# Patient Record
Sex: Female | Born: 1990 | Race: Asian | Hispanic: No | Marital: Single | Smoking: Never smoker
Health system: Southern US, Community
[De-identification: ages and names within clinical notes are randomized; demographics above are authoritative.]

## PROBLEM LIST (undated history)

## (undated) ENCOUNTER — Inpatient Hospital Stay: Payer: Self-pay

## (undated) DIAGNOSIS — F419 Anxiety disorder, unspecified: Secondary | ICD-10-CM

## (undated) DIAGNOSIS — Z789 Other specified health status: Secondary | ICD-10-CM

## (undated) DIAGNOSIS — F32A Depression, unspecified: Secondary | ICD-10-CM

## (undated) HISTORY — DX: Other specified health status: Z78.9

## (undated) HISTORY — PX: NO PAST SURGERIES: SHX2092

---
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
## 2010-08-14 ENCOUNTER — Emergency Department: Payer: Self-pay | Admitting: Emergency Medicine

## 2011-09-28 ENCOUNTER — Emergency Department: Payer: Self-pay | Admitting: Emergency Medicine

## 2012-08-13 ENCOUNTER — Emergency Department: Payer: Self-pay | Admitting: Emergency Medicine

## 2015-08-03 LAB — HM HIV SCREENING LAB: HM HIV Screening: NEGATIVE

## 2017-02-28 ENCOUNTER — Encounter: Payer: Self-pay | Admitting: Obstetrics and Gynecology

## 2017-03-28 ENCOUNTER — Encounter: Payer: Self-pay | Admitting: Obstetrics and Gynecology

## 2017-05-30 ENCOUNTER — Encounter: Payer: Self-pay | Admitting: Maternal Newborn

## 2017-07-04 ENCOUNTER — Encounter: Payer: Self-pay | Admitting: Maternal Newborn

## 2017-07-04 ENCOUNTER — Ambulatory Visit (INDEPENDENT_AMBULATORY_CARE_PROVIDER_SITE_OTHER): Payer: BLUE CROSS/BLUE SHIELD | Admitting: Maternal Newborn

## 2017-07-04 VITALS — BP 100/70 | HR 78 | Ht 67.0 in | Wt 148.0 lb

## 2017-07-04 DIAGNOSIS — Z131 Encounter for screening for diabetes mellitus: Secondary | ICD-10-CM | POA: Diagnosis not present

## 2017-07-04 DIAGNOSIS — Z13220 Encounter for screening for lipoid disorders: Secondary | ICD-10-CM

## 2017-07-04 DIAGNOSIS — Z113 Encounter for screening for infections with a predominantly sexual mode of transmission: Secondary | ICD-10-CM

## 2017-07-04 DIAGNOSIS — Z01419 Encounter for gynecological examination (general) (routine) without abnormal findings: Secondary | ICD-10-CM | POA: Diagnosis not present

## 2017-07-04 DIAGNOSIS — Z124 Encounter for screening for malignant neoplasm of cervix: Secondary | ICD-10-CM

## 2017-07-04 DIAGNOSIS — Z1329 Encounter for screening for other suspected endocrine disorder: Secondary | ICD-10-CM

## 2017-07-04 DIAGNOSIS — Z1321 Encounter for screening for nutritional disorder: Secondary | ICD-10-CM | POA: Diagnosis not present

## 2017-07-04 NOTE — Progress Notes (Signed)
Gynecology Annual Exam  PCP: Patient, No Pcp Per  Chief Complaint:  Chief Complaint  Patient presents with  . Gynecologic Exam    History of Present Illness: Patient is a 26 y.o. G2P0020 presenting for an annual exam. The patient has no complaints today.   LMP: Patient's last menstrual period was 06/22/2017 (exact date). Average Interval: regular, 28 days Duration of flow: a few days Heavy Menses: no Clots: no Intermenstrual Bleeding: no Postcoital Bleeding: no Dysmenorrhea: no  The patient is sexually active. She currently uses condoms for contraception. She denies dyspareunia.  The patient does not perform self breast exams.  There is no notable family history of breast or ovarian cancer in her family.  The patient wears seatbelts: yes.   The patient has regular exercise: yes.    The patient denies current symptoms of depression.    Review of Systems  Constitutional: Negative for chills, fever and weight loss.  HENT: Negative for ear pain, hearing loss and sore throat.   Eyes: Negative for blurred vision, discharge and redness.  Respiratory: Negative for cough and shortness of breath.   Cardiovascular: Negative for chest pain and palpitations.  Gastrointestinal: Negative for abdominal pain, constipation, diarrhea, heartburn and nausea.  Genitourinary: Negative for dysuria, frequency and urgency.  Musculoskeletal: Negative.   Skin: Negative.   Neurological: Negative for dizziness, sensory change and headaches.  Endo/Heme/Allergies: Negative.   Psychiatric/Behavioral: Negative for depression. The patient is not nervous/anxious.   All other systems reviewed and are negative.   Past Medical History:  History reviewed. No pertinent past medical history.  Past Surgical History:  Past Surgical History:  Procedure Laterality Date  . NO PAST SURGERIES      Gynecologic History:  Patient's last menstrual period was 06/22/2017 (exact date). Contraception:  condoms Last Pap: Normal per patient, unsure of last date  Obstetric History: G2P0020  Family History:  History reviewed. No pertinent family history.  Social History:  Social History   Socioeconomic History  . Marital status: Single    Spouse name: Not on file  . Number of children: Not on file  . Years of education: Not on file  . Highest education level: Not on file  Social Needs  . Financial resource strain: Not on file  . Food insecurity - worry: Not on file  . Food insecurity - inability: Not on file  . Transportation needs - medical: Not on file  . Transportation needs - non-medical: Not on file  Occupational History  . Not on file  Tobacco Use  . Smoking status: Never Smoker  . Smokeless tobacco: Never Used  Substance and Sexual Activity  . Alcohol use: No    Frequency: Never  . Drug use: No  . Sexual activity: Yes    Birth control/protection: Condom  Other Topics Concern  . Not on file  Social History Narrative  . Not on file    Allergies:  No Known Allergies  Medications: Prior to Admission medications   Not on File    Physical Exam Vitals: Blood pressure 100/70, pulse 78, height 5\' 7"  (1.702 m), weight 148 lb (67.1 kg), last menstrual period 06/22/2017.  General: NAD HEENT: normocephalic, anicteric Thyroid: no enlargement, no palpable nodules Pulmonary: No increased work of breathing, CTAB Cardiovascular: RRR, distal pulses 2+ Breasts: Breasts symmetrical, no tenderness, no palpable nodules or masses, no skin or nipple retraction present, no nipple discharge.  No axillary or supraclavicular lymphadenopathy. Abdomen: NABS, soft, non-tender, non-distended.  Umbilicus without lesions.  No hepatomegaly, splenomegaly or masses palpable. No evidence of hernia  Genitourinary:  External: Normal external female genitalia.  Normal urethral  meatus, normal Bartholin's and Skene's glands.    Vagina: Normal vaginal mucosa, no evidence of prolapse.    Cervix:  Grossly normal in appearance, no bleeding  Uterus: Non-enlarged, mobile, normal contour.  No CMT  Adnexa: ovaries non-enlarged, no adnexal masses  Rectal: deferred  Lymphatic: no evidence of inguinal lymphadenopathy Extremities: no edema, erythema, or tenderness Neurologic: Grossly intact Psychiatric: mood appropriate, affect full  Assessment: 26 y.o. female routine annual exam.  Plan: Problem List Items Addressed This Visit    Encounter for annual routine gynecological examination   Relevant Orders   CBC   Comprehensive metabolic panel    Other Visit Diagnoses    Pap smear for cervical cancer screening    -  Primary   Relevant Orders   IGP,CtNgTv,rfx Aptima HPV ASCU   Screening for STDs (sexually transmitted diseases)       Relevant Orders   IGP,CtNgTv,rfx Aptima HPV ASCU   Screening for thyroid disorder       Relevant Orders   TSH   Encounter for vitamin deficiency screening       Relevant Orders   Vitamin D (25 hydroxy)   Screening cholesterol level       Relevant Orders   Lipid panel   Screening for diabetes mellitus       Relevant Orders   Hemoglobin A1c      1) STI screening was offered and accepted.  2) ASCCP guidelines and rationale discussed.  Patient opts for every 3 year screening interval.  3) Contraception - Patient is using barrier methods. Does not currently desire another method.  4) Routine healthcare maintenance including cholesterol, diabetes screening discussed: ordered today and she will return fasting at a later date for lab draw.  5) Follow up 1 year for routine annual exam.  Marcelyn BruinsJacelyn Sydelle Sherfield, CNM 07/04/2017  8:58 AM

## 2017-07-07 ENCOUNTER — Other Ambulatory Visit: Payer: BLUE CROSS/BLUE SHIELD

## 2017-07-08 LAB — IGP,CTNGTV,RFX APTIMA HPV ASCU
Chlamydia, Nuc. Acid Amp: NEGATIVE
Gonococcus, Nuc. Acid Amp: NEGATIVE
PAP SMEAR COMMENT: 0
Trich vag by NAA: NEGATIVE

## 2018-01-22 ENCOUNTER — Other Ambulatory Visit: Payer: Self-pay

## 2018-01-22 ENCOUNTER — Encounter: Payer: Self-pay | Admitting: Emergency Medicine

## 2018-01-22 ENCOUNTER — Emergency Department
Admission: EM | Admit: 2018-01-22 | Discharge: 2018-01-22 | Disposition: A | Discharge status: Home / Self Care | Payer: Self-pay | Attending: Emergency Medicine | Admitting: Emergency Medicine

## 2018-01-22 DIAGNOSIS — O9989 Other specified diseases and conditions complicating pregnancy, childbirth and the puerperium: Secondary | ICD-10-CM | POA: Insufficient documentation

## 2018-01-22 DIAGNOSIS — R630 Anorexia: Secondary | ICD-10-CM | POA: Insufficient documentation

## 2018-01-22 DIAGNOSIS — Z3A01 Less than 8 weeks gestation of pregnancy: Secondary | ICD-10-CM | POA: Insufficient documentation

## 2018-01-22 LAB — CBC WITH DIFFERENTIAL/PLATELET
BASOS PCT: 1 %
Basophils Absolute: 0.1 10*3/uL (ref 0–0.1)
EOS ABS: 0.1 10*3/uL (ref 0–0.7)
Eosinophils Relative: 3 %
HCT: 41.2 % (ref 35.0–47.0)
HEMOGLOBIN: 13.8 g/dL (ref 12.0–16.0)
LYMPHS PCT: 43 %
Lymphs Abs: 2 10*3/uL (ref 1.0–3.6)
MCH: 31.1 pg (ref 26.0–34.0)
MCHC: 33.5 g/dL (ref 32.0–36.0)
MCV: 92.8 fL (ref 80.0–100.0)
MONOS PCT: 8 %
Monocytes Absolute: 0.3 10*3/uL (ref 0.2–0.9)
NEUTROS ABS: 2.1 10*3/uL (ref 1.4–6.5)
Neutrophils Relative %: 45 %
Platelets: 234 10*3/uL (ref 150–440)
RBC: 4.43 MIL/uL (ref 3.80–5.20)
RDW: 12.8 % (ref 11.5–14.5)
WBC: 4.6 10*3/uL (ref 3.6–11.0)

## 2018-01-22 LAB — COMPREHENSIVE METABOLIC PANEL
ALBUMIN: 4.4 g/dL (ref 3.5–5.0)
ALK PHOS: 40 U/L (ref 38–126)
ALT: 9 U/L — AB (ref 14–54)
AST: 30 U/L (ref 15–41)
Anion gap: 9 (ref 5–15)
BUN: 12 mg/dL (ref 6–20)
CO2: 23 mmol/L (ref 22–32)
CREATININE: 0.68 mg/dL (ref 0.44–1.00)
Calcium: 9.3 mg/dL (ref 8.9–10.3)
Chloride: 103 mmol/L (ref 101–111)
GFR calc Af Amer: >60 mL/min (ref >60)
GFR calc non Af Amer: >60 mL/min (ref >60)
GLUCOSE: 119 mg/dL — AB (ref 65–99)
Potassium: 3.5 mmol/L (ref 3.5–5.1)
SODIUM: 135 mmol/L (ref 135–145)
Total Bilirubin: 1 mg/dL (ref 0.3–1.2)
Total Protein: 8.1 g/dL (ref 6.5–8.1)

## 2018-01-22 LAB — URINALYSIS, COMPLETE (UACMP) WITH MICROSCOPIC
Bacteria, UA: NONE SEEN
Bilirubin Urine: NEGATIVE
Glucose, UA: NEGATIVE mg/dL
HGB URINE DIPSTICK: NEGATIVE
Ketones, ur: 20 mg/dL — AB
LEUKOCYTES UA: NEGATIVE
Nitrite: NEGATIVE
PH: 6 (ref 5.0–8.0)
PROTEIN: NEGATIVE mg/dL
Specific Gravity, Urine: 1.013 (ref 1.005–1.030)

## 2018-01-22 LAB — TSH: TSH: 1.833 u[IU]/mL (ref 0.350–4.500)

## 2018-01-22 LAB — HCG, QUANTITATIVE, PREGNANCY: hCG, Beta Chain, Quant, S: 384 m[IU]/mL — ABNORMAL HIGH (ref <5)

## 2018-01-22 LAB — POCT PREGNANCY, URINE: Preg Test, Ur: POSITIVE — AB

## 2018-01-22 LAB — LIPASE, BLOOD: Lipase: 38 U/L (ref 11–51)

## 2018-01-22 MED ORDER — METOCLOPRAMIDE HCL 10 MG PO TABS: 10.0000 mg | ORAL_TABLET | Freq: Three times a day (TID) | ORAL | 0 refills | Status: DC | PRN

## 2018-01-22 NOTE — ED Notes (Signed)
MD at bedside. 

## 2018-01-22 NOTE — ED Notes (Signed)
Pt drinking water at this time. Pt aware urine sample is needed and has a sample cup at bedside.

## 2018-01-22 NOTE — ED Triage Notes (Signed)
Patient reports decreased appetite and nausea x1-2 weeks. States she has also had decreased energy and "doesn't feel like doing anything." Patient denies fever, vomiting, diarrhea.

## 2018-01-22 NOTE — Discharge Instructions (Addendum)
Please seek medical attention for any high fevers, chest pain, shortness of breath, change in behavior, persistent vomiting, bloody stool or any other new or concerning symptoms.  

## 2018-01-22 NOTE — ED Provider Notes (Signed)
[facility] Emergency Department Provider Note  ____________________________________________   I have reviewed the triage vital signs and the nursing notes.   HISTORY  Chief Complaint Anorexia   History limited by: Not Limited   HPI Erica Huerta is a 27 y.o. female who presents to the emergency department today having concern for decreased appetite.  She states this is been going on for about 1 or 2 weeks.  She just feels like she does not have any interest in food.  She can go throughout the day without eating.  She has not had any associated abdominal pain nausea or vomiting.  In addition she has felt increased weakness and tiredness.  She has been more sleepy than normal.  She feels like she is sleeping fine at night.  She denies any depression.  She denies similar symptoms in the past.    History reviewed. No pertinent past medical history.  Patient Active Problem List   Diagnosis Date Noted  . Encounter for annual routine gynecological examination 07/04/2017    Past Surgical History:  Procedure Laterality Date  . NO PAST SURGERIES      Prior to Admission medications   Not on File    Allergies Patient has no known allergies.  No family history on file.  Social History Social History   Tobacco Use  . Smoking status: Never Smoker  . Smokeless tobacco: Never Used  Substance Use Topics  . Alcohol use: Yes    Frequency: Never    Comment: occasional  . Drug use: No    Review of Systems Constitutional: No fever/chills Eyes: No visual changes. ENT: No sore throat. Cardiovascular: Denies chest pain. Respiratory: Denies shortness of breath. Gastrointestinal: Positive for decreased appetite.  Genitourinary: Negative for dysuria. Musculoskeletal: Negative for back pain. Skin: Negative for rash. Neurological: Negative for headaches, focal weakness or numbness.  ____________________________________________   PHYSICAL  EXAM:  VITAL SIGNS: ED Triage Vitals [01/22/18 1925]  Enc Vitals Group     BP 113/72     Pulse Rate 85     Resp 16     Temp 98.5 F (36.9 C)     Temp Source Oral     SpO2 100 %     Weight 148 lb (67.1 kg)     Height 5\' 7"  (1.702 m)     Head Circumference      Peak Flow      Pain Score 0   Constitutional: Alert and oriented.  Eyes: Conjunctivae are normal.  ENT      Head: Normocephalic and atraumatic.      Nose: No congestion/rhinnorhea.      Mouth/Throat: Mucous membranes are moist.      Neck: No stridor. Hematological/Lymphatic/Immunilogical: No cervical lymphadenopathy. Cardiovascular: Normal rate, regular rhythm.  No murmurs, rubs, or gallops.  Respiratory: Normal respiratory effort without tachypnea nor retractions. Breath sounds are clear and equal bilaterally. No wheezes/rales/rhonchi. Gastrointestinal: Soft and non tender. No rebound. No guarding.  Genitourinary: Deferred Musculoskeletal: Normal range of motion in all extremities. No lower extremity edema. Neurologic:  Normal speech and language. No gross focal neurologic deficits are appreciated.  Skin:  Skin is warm, dry and intact. No rash noted. Psychiatric: Mood and affect are normal. Speech and behavior are normal. Patient exhibits appropriate insight and judgment.  ____________________________________________    LABS (pertinent positives/negatives)  Upreg positive Lipase 38 hcg 384 CBC wbc 4.6, hgb 13.8, plt 234 UA not consistent with infection CMP na 135, k 3.5, glu  119, cr 0.68  ____________________________________________   EKG  None  ____________________________________________    RADIOLOGY  None  ____________________________________________   PROCEDURES  Procedures  ____________________________________________   INITIAL IMPRESSION / ASSESSMENT AND PLAN / ED COURSE  Pertinent labs & imaging results that were available during my care of the patient were reviewed by me and  considered in my medical decision making (see chart for details).   Patient presented to the emergency department today because of concern for decreased appetite and fatigue. Differential would be broad including anemia, electrolyte abnormality, depression, pregnancy, infection amongst other etiologies. Work up consistent with very early pregnancy. Discussed this finding with the patient. Will plan on prescribing reglan and discussed importance of prenatal care and vitamins.   ____________________________________________   FINAL CLINICAL IMPRESSION(S) / ED DIAGNOSES  Final diagnoses:  Decreased appetite  Less than [redacted] weeks gestation of pregnancy     Note: This dictation was prepared with Nurse, children's dictation. Any transcriptional errors that result from this process are unintentional     Phineas Semen, MD 01/22/18 2122

## 2018-02-15 ENCOUNTER — Other Ambulatory Visit: Payer: Self-pay

## 2018-02-15 ENCOUNTER — Encounter: Payer: Self-pay | Admitting: Emergency Medicine

## 2018-02-15 ENCOUNTER — Emergency Department
Admission: EM | Admit: 2018-02-15 | Discharge: 2018-02-16 | Disposition: A | Discharge status: Home / Self Care | Payer: BLUE CROSS/BLUE SHIELD | Attending: Emergency Medicine | Admitting: Emergency Medicine

## 2018-02-15 DIAGNOSIS — O219 Vomiting of pregnancy, unspecified: Secondary | ICD-10-CM | POA: Insufficient documentation

## 2018-02-15 DIAGNOSIS — O210 Mild hyperemesis gravidarum: Secondary | ICD-10-CM

## 2018-02-15 DIAGNOSIS — R1030 Lower abdominal pain, unspecified: Secondary | ICD-10-CM | POA: Diagnosis not present

## 2018-02-15 DIAGNOSIS — Z3A01 Less than 8 weeks gestation of pregnancy: Secondary | ICD-10-CM | POA: Insufficient documentation

## 2018-02-15 DIAGNOSIS — Z79899 Other long term (current) drug therapy: Secondary | ICD-10-CM | POA: Diagnosis not present

## 2018-02-15 DIAGNOSIS — Z3491 Encounter for supervision of normal pregnancy, unspecified, first trimester: Secondary | ICD-10-CM

## 2018-02-15 LAB — URINALYSIS, COMPLETE (UACMP) WITH MICROSCOPIC
Bacteria, UA: NONE SEEN
Bilirubin Urine: NEGATIVE
GLUCOSE, UA: 50 mg/dL — AB
Hgb urine dipstick: NEGATIVE
Ketones, ur: 5 mg/dL — AB
Leukocytes, UA: NEGATIVE
Nitrite: NEGATIVE
PH: 7 (ref 5.0–8.0)
Protein, ur: NEGATIVE mg/dL
Specific Gravity, Urine: 1.009 (ref 1.005–1.030)

## 2018-02-15 LAB — CBC
HCT: 41.6 % (ref 35.0–47.0)
Hemoglobin: 14 g/dL (ref 12.0–16.0)
MCH: 30.8 pg (ref 26.0–34.0)
MCHC: 33.6 g/dL (ref 32.0–36.0)
MCV: 91.6 fL (ref 80.0–100.0)
PLATELETS: 245 10*3/uL (ref 150–440)
RBC: 4.54 MIL/uL (ref 3.80–5.20)
RDW: 12.3 % (ref 11.5–14.5)
WBC: 4.3 10*3/uL (ref 3.6–11.0)

## 2018-02-15 LAB — COMPREHENSIVE METABOLIC PANEL
ALBUMIN: 4.4 g/dL (ref 3.5–5.0)
ALT: 10 U/L (ref 0–44)
ANION GAP: 9 (ref 5–15)
AST: 25 U/L (ref 15–41)
Alkaline Phosphatase: 38 U/L (ref 38–126)
BILIRUBIN TOTAL: 0.8 mg/dL (ref 0.3–1.2)
BUN: 9 mg/dL (ref 6–20)
CHLORIDE: 105 mmol/L (ref 98–111)
CO2: 23 mmol/L (ref 22–32)
Calcium: 9.2 mg/dL (ref 8.9–10.3)
Creatinine, Ser: 0.54 mg/dL (ref 0.44–1.00)
GFR calc Af Amer: >60 mL/min (ref >60)
GFR calc non Af Amer: >60 mL/min (ref >60)
GLUCOSE: 144 mg/dL — AB (ref 70–99)
POTASSIUM: 3.4 mmol/L — AB (ref 3.5–5.1)
Sodium: 137 mmol/L (ref 135–145)
TOTAL PROTEIN: 8.2 g/dL — AB (ref 6.5–8.1)

## 2018-02-15 LAB — HCG, QUANTITATIVE, PREGNANCY: hCG, Beta Chain, Quant, S: 66531 m[IU]/mL — ABNORMAL HIGH (ref <5)

## 2018-02-15 LAB — LIPASE, BLOOD: Lipase: 35 U/L (ref 11–51)

## 2018-02-15 MED ORDER — DEXTROSE-NACL 5-0.45 % IV SOLN
Freq: Once | INTRAVENOUS | Status: AC
  Administered 2018-02-15: 22:00:00 via INTRAVENOUS

## 2018-02-15 MED ORDER — ONDANSETRON HCL 4 MG/2ML IJ SOLN
4.0000 mg | Freq: Once | INTRAMUSCULAR | Status: AC
  Administered 2018-02-15: 4 mg via INTRAVENOUS
  Filled 2018-02-15: qty 2

## 2018-02-15 MED ORDER — METOCLOPRAMIDE HCL 5 MG/ML IJ SOLN
10.0000 mg | Freq: Once | INTRAMUSCULAR | Status: AC
  Administered 2018-02-15: 10 mg via INTRAVENOUS
  Filled 2018-02-15: qty 2

## 2018-02-15 MED ORDER — DIPHENHYDRAMINE HCL 50 MG/ML IJ SOLN
25.0000 mg | Freq: Once | INTRAMUSCULAR | Status: AC
  Administered 2018-02-15: 25 mg via INTRAVENOUS
  Filled 2018-02-15: qty 1

## 2018-02-15 MED ORDER — METOCLOPRAMIDE HCL 10 MG PO TABS: 10.0000 mg | ORAL_TABLET | Freq: Four times a day (QID) | ORAL | 0 refills | Status: DC | PRN

## 2018-02-15 NOTE — ED Provider Notes (Signed)
-----------------------------------------   10:51 PM on 02/15/2018 -----------------------------------------  Patient states she is feeling much better, still has approximate 500 cc of fluid remaining, urinalysis pending.  Denies any discomfort, states the nausea is much improved.  We will plan to discharge with Reglan once urinalysis has resulted.  Patient care signed out to overnight physician.   Minna AntisPaduchowski, Toneisha Savary, MD 02/15/18 2252

## 2018-02-15 NOTE — ED Provider Notes (Signed)
[facility] Emergency Department Provider Note  ____________________________________________  Time seen: Approximately 9:53 PM  I have reviewed the triage vital signs and the nursing notes.   HISTORY  Chief Complaint Emesis and Abdominal Pain    HPI Erica Huerta is a 27 y.o. female no past medical history who complains of low abdominal pain radiating to the back, mild, waxing and waning without aggravating or alleviating factors associated with nausea vomiting difficulty eating over the past 3 weeks ever since she learned that she was pregnant.  LMP was Jan 07, 2018, patient's approximately 5 and half weeks.  No prenatal care as of yet.  No vaginal bleeding.  No fever dysuria frequency.  With her symptoms she has had decreased urine output recently.      Past medical history negative   Patient Active Problem List   Diagnosis Date Noted  . Encounter for annual routine gynecological examination 07/04/2017     Past Surgical History:  Procedure Laterality Date  . NO PAST SURGERIES       Prior to Admission medications   Medication Sig Start Date End Date Taking? Authorizing Provider  metoCLOPramide (REGLAN) 10 MG tablet Take 1 tablet (10 mg total) by mouth every 8 (eight) hours as needed for nausea or vomiting. 01/22/18 01/22/19  Phineas SemenGoodman, Graydon, MD     Allergies Patient has no known allergies.   No family history on file.  Social History Social History   Tobacco Use  . Smoking status: Never Smoker  . Smokeless tobacco: Never Used  Substance Use Topics  . Alcohol use: Yes    Frequency: Never    Comment: occasional  . Drug use: No    Review of Systems  Constitutional:   No fever or chills.  ENT:   No sore throat. No rhinorrhea. Cardiovascular:   No chest pain or syncope. Respiratory:   No dyspnea or cough. Gastrointestinal:   Positive as above for abdominal pain and vomiting.  No constipation Musculoskeletal:   Negative for focal  pain or swelling All other systems reviewed and are negative except as documented above in ROS and HPI.  ____________________________________________   PHYSICAL EXAM:  VITAL SIGNS: ED Triage Vitals  Enc Vitals Group     BP 02/15/18 2048 111/60     Pulse Rate 02/15/18 2048 79     Resp 02/15/18 2048 18     Temp 02/15/18 2048 97.6 F (36.4 C)     Temp Source 02/15/18 2048 Oral     SpO2 02/15/18 2048 100 %     Weight --      Height --      Head Circumference --      Peak Flow --      Pain Score 02/15/18 2046 7     Pain Loc --      Pain Edu? --      Excl. in GC? --     Vital signs reviewed, nursing assessments reviewed.   Constitutional:   Alert and oriented. Non-toxic appearance. Eyes:   Conjunctivae are normal. EOMI. PERRL. ENT      Head:   Normocephalic and atraumatic.      Nose:   No congestion/rhinnorhea.       Mouth/Throat:   MMM, no pharyngeal erythema. No peritonsillar mass.       Neck:   No meningismus. Full ROM. Hematological/Lymphatic/Immunilogical:   No cervical lymphadenopathy. Cardiovascular:   RRR. Symmetric bilateral radial and DP pulses.  No murmurs.  Respiratory:  Normal respiratory effort without tachypnea/retractions. Breath sounds are clear and equal bilaterally. No wheezes/rales/rhonchi. Gastrointestinal:   Soft and nontender. Non distended. There is no CVA tenderness.  No rebound, rigidity, or guarding.  Musculoskeletal:   Normal range of motion in all extremities. No joint effusions.  No lower extremity tenderness.  No edema. Neurologic:   Normal speech and language.  Motor grossly intact. No acute focal neurologic deficits are appreciated.  Skin:    Skin is warm, dry and intact. No rash noted.  No petechiae, purpura, or bullae.  ____________________________________________    LABS (pertinent positives/negatives) (all labs ordered are listed, but only abnormal results are displayed) Labs Reviewed  COMPREHENSIVE METABOLIC PANEL - Abnormal;  Notable for the following components:      Result Value   Potassium 3.4 (*)    Glucose, Bld 144 (*)    Total Protein 8.2 (*)    All other components within normal limits  LIPASE, BLOOD  CBC  URINALYSIS, COMPLETE (UACMP) WITH MICROSCOPIC  HCG, QUANTITATIVE, PREGNANCY   ____________________________________________   EKG    ____________________________________________    RADIOLOGY  No results found.  ____________________________________________   PROCEDURES Procedures  ____________________________________________  DIFFERENTIAL DIAGNOSIS   Urinary tract infection, hyperemesis/morning sickness with dehydration  CLINICAL IMPRESSION / ASSESSMENT AND PLAN / ED COURSE  Pertinent labs & imaging results that were available during my care of the patient were reviewed by me and considered in my medical decision making (see chart for details).    Patient is nontoxic, normal vital signs, normal serum labs, presents with 2 to 3 weeks of nausea and vomiting in first trimester pregnancy.  Suspected degree of dehydration due to difficulty with oral intake.  I will treat symptomatically with IV fluids with D5 half-normal saline, Zofran Reglan Benadryl IV.   If she remains unable to take oral intake and has severe ketosis, she may need overnight hospitalization.  Otherwise, anticipate she will be stable for discharge with or without antibiotics as needed.      ____________________________________________   FINAL CLINICAL IMPRESSION(S) / ED DIAGNOSES    Final diagnoses:  First trimester pregnancy  Morning sickness     ED Discharge Orders    None      Portions of this note were generated with dragon dictation software. Dictation errors may occur despite best attempts at proofreading.    Sharman Cheek, MD 02/15/18 2157

## 2018-02-15 NOTE — ED Notes (Signed)
Report called to Nicole RN

## 2018-02-15 NOTE — ED Triage Notes (Addendum)
Patient reports found out 3 weeks ago she was pregnant.  Reports since then she is unable to eat a full course meal, abdominal pain,  nausea, vomiting and no energy.

## 2018-02-16 NOTE — ED Notes (Signed)
Pt resting quietly in bed; visitor has arrived to see pt; pt up to BR to void; will collect specimen

## 2018-03-27 LAB — OB RESULTS CONSOLE HEPATITIS B SURFACE ANTIGEN: Hepatitis B Surface Ag: NEGATIVE

## 2018-03-27 LAB — OB RESULTS CONSOLE RUBELLA ANTIBODY, IGM: Rubella: IMMUNE

## 2018-03-27 LAB — OB RESULTS CONSOLE HIV ANTIBODY (ROUTINE TESTING): HIV: NONREACTIVE

## 2018-03-27 LAB — OB RESULTS CONSOLE RPR: RPR: NONREACTIVE

## 2018-03-30 ENCOUNTER — Other Ambulatory Visit: Payer: Self-pay | Admitting: Certified Nurse Midwife

## 2018-03-30 DIAGNOSIS — Z369 Encounter for antenatal screening, unspecified: Secondary | ICD-10-CM

## 2018-04-09 ENCOUNTER — Ambulatory Visit: Payer: BLUE CROSS/BLUE SHIELD

## 2018-04-09 ENCOUNTER — Ambulatory Visit: Admission: RE | Admit: 2018-04-09 | Payer: BLUE CROSS/BLUE SHIELD | Source: Ambulatory Visit

## 2018-06-23 ENCOUNTER — Observation Stay
Admission: EM | Admit: 2018-06-23 | Discharge: 2018-06-23 | Disposition: A | Discharge status: Home / Self Care | Payer: BLUE CROSS/BLUE SHIELD | Attending: Certified Nurse Midwife | Admitting: Certified Nurse Midwife

## 2018-06-23 ENCOUNTER — Other Ambulatory Visit: Payer: Self-pay

## 2018-06-23 DIAGNOSIS — Z3A26 26 weeks gestation of pregnancy: Secondary | ICD-10-CM | POA: Insufficient documentation

## 2018-06-23 DIAGNOSIS — O212 Late vomiting of pregnancy: Principal | ICD-10-CM | POA: Insufficient documentation

## 2018-06-23 DIAGNOSIS — Z79899 Other long term (current) drug therapy: Secondary | ICD-10-CM | POA: Diagnosis not present

## 2018-06-23 DIAGNOSIS — O219 Vomiting of pregnancy, unspecified: Secondary | ICD-10-CM | POA: Diagnosis present

## 2018-06-23 LAB — URINALYSIS, COMPLETE (UACMP) WITH MICROSCOPIC
Bilirubin Urine: NEGATIVE
Glucose, UA: NEGATIVE mg/dL
Hgb urine dipstick: NEGATIVE
Ketones, ur: NEGATIVE mg/dL
Leukocytes, UA: NEGATIVE
Nitrite: NEGATIVE
Protein, ur: NEGATIVE mg/dL
Specific Gravity, Urine: 1.005 (ref 1.005–1.030)
pH: 8 (ref 5.0–8.0)

## 2018-06-23 LAB — CBC
HCT: 32.6 % — ABNORMAL LOW (ref 36.0–46.0)
HEMOGLOBIN: 10.7 g/dL — AB (ref 12.0–15.0)
MCH: 30.3 pg (ref 26.0–34.0)
MCHC: 32.8 g/dL (ref 30.0–36.0)
MCV: 92.4 fL (ref 80.0–100.0)
Platelets: 243 10*3/uL (ref 150–400)
RBC: 3.53 MIL/uL — AB (ref 3.87–5.11)
RDW: 12.8 % (ref 11.5–15.5)
WBC: 5.2 10*3/uL (ref 4.0–10.5)
nRBC: 0 % (ref 0.0–0.2)

## 2018-06-23 LAB — PROTEIN / CREATININE RATIO, URINE
CREATININE, URINE: 40 mg/dL
Total Protein, Urine: <6 mg/dL

## 2018-06-23 LAB — COMPREHENSIVE METABOLIC PANEL
ALBUMIN: 3.5 g/dL (ref 3.5–5.0)
ALT: 16 U/L (ref 0–44)
AST: 27 U/L (ref 15–41)
Alkaline Phosphatase: 78 U/L (ref 38–126)
Anion gap: 7 (ref 5–15)
BILIRUBIN TOTAL: 0.5 mg/dL (ref 0.3–1.2)
BUN: 6 mg/dL (ref 6–20)
CHLORIDE: 106 mmol/L (ref 98–111)
CO2: 21 mmol/L — ABNORMAL LOW (ref 22–32)
Calcium: 8.8 mg/dL — ABNORMAL LOW (ref 8.9–10.3)
Creatinine, Ser: 0.39 mg/dL — ABNORMAL LOW (ref 0.44–1.00)
GFR calc Af Amer: >60 mL/min (ref >60)
GFR calc non Af Amer: >60 mL/min (ref >60)
GLUCOSE: 75 mg/dL (ref 70–99)
Potassium: 3.6 mmol/L (ref 3.5–5.1)
Sodium: 134 mmol/L — ABNORMAL LOW (ref 135–145)
Total Protein: 7 g/dL (ref 6.5–8.1)

## 2018-06-23 LAB — LIPASE, BLOOD: Lipase: 37 U/L (ref 11–51)

## 2018-06-23 LAB — AMYLASE: AMYLASE: 107 U/L — AB (ref 28–100)

## 2018-06-23 MED ORDER — ACETAMINOPHEN 325 MG PO TABS: 650.0000 mg | ORAL_TABLET | ORAL | Status: DC | PRN

## 2018-06-23 MED ORDER — ONDANSETRON HCL 4 MG/2ML IJ SOLN
4.0000 mg | Freq: Three times a day (TID) | INTRAMUSCULAR | Status: DC
  Administered 2018-06-23: 4 mg via INTRAVENOUS
  Filled 2018-06-23: qty 2

## 2018-06-23 MED ORDER — ONDANSETRON 4 MG PO TBDP: 4.0000 mg | ORAL_TABLET | Freq: Four times a day (QID) | ORAL | 1 refills | Status: DC | PRN

## 2018-06-23 MED ORDER — LACTATED RINGERS IV BOLUS
1000.0000 mL | Freq: Once | INTRAVENOUS | Status: AC
  Administered 2018-06-23: 1000 mL via INTRAVENOUS

## 2018-06-23 MED ORDER — ONDANSETRON 4 MG PO TBDP
4.0000 mg | ORAL_TABLET | Freq: Four times a day (QID) | ORAL | Status: DC
  Administered 2018-06-23: 4 mg via ORAL
  Filled 2018-06-23: qty 1

## 2018-06-23 MED ORDER — LACTATED RINGERS IV SOLN
INTRAVENOUS | Status: DC
  Administered 2018-06-23: 14:00:00 via INTRAVENOUS

## 2018-06-23 NOTE — OB Triage Note (Signed)
Pt G1P0 [redacted]w[redacted]d presents to L&D for n/v since Saturday at 0100. Pt states she has been nauseous since Saturday and vomited anytime she has tried to eat. Pt called the office today and they advised her to come in to be evaluated. Pt denies ctx, vaginal bleeding, vaginal discharge, and states + FM. Monitors applied and assessing. VSS. CNM notified of pt complaint and arrival.

## 2018-06-23 NOTE — Progress Notes (Signed)
   Erica Huerta is a 27 y.o. female. She is at [redacted]w[redacted]d gestation. Patient's last menstrual period was 01/07/2018 (approximate). Estimated Date of Delivery: 09/28/18  Prenatal care site: [facility]  Chief complaint: nausea and vomiting Location: stomach Onset/timing: three days ago Duration: constant Quality: nausea constantly, vomiting whenever she eats Severity: moderate to severe Aggravating or alleviating conditions: none Associated signs/symptoms: none Context: Erica Huerta reports nausea and vomiting that started on Saturday. She had nausea and vomiting in her first trimester of pregnancy, but none since. She reports that she feels nauseous constantly and has vomited anything that she tries to eat. She reports no diarrhea, with last BM yesterday.   S: Resting comfortably.   She reports:  -active fetal movement -no leakage of fluid  -no vaginal bleeding -no contractions  Maternal Medical History:  History reviewed. No pertinent past medical history.  Past Surgical History:  Procedure Laterality Date  . NO PAST SURGERIES      No Known Allergies  Prior to Admission medications   Medication Sig Start Date End Date Taking? Authorizing Provider  metoCLOPramide (REGLAN) 10 MG tablet Take 1 tablet (10 mg total) by mouth every 6 (six) hours as needed for nausea. Patient not taking: Reported on 06/23/2018 02/15/18   Minna Antis, MD     Social History: She  reports that she has never smoked. She has never used smokeless tobacco. She reports that she drinks alcohol. She reports that she does not use drugs.  Family History: family history includes Anxiety disorder in her mother; Depression in her mother; Thyroid disease in her maternal grandmother and mother.   Review of Systems: A full review of systems was performed and negative except as noted in the HPI.     O:  BP 102/64 (BP Location: Left Arm)   Pulse 82   Temp 98.2 F (36.8 C) (Oral)   Resp 16   Ht 5'  7" (1.702 m)   Wt 73 kg   LMP 01/07/2018 (Approximate)   BMI 25.22 kg/m  No results found for this or any previous visit (from the past 48 hour(s)).   Constitutional: NAD, AAOx3  HE/ENT: extraocular movements grossly intact, moist mucous membranes CV: RRR PULM: normal respiratory effort, CTABL     Abd: gravid, non-tender, non-distended, soft      Ext: Non-tender, Nonedmeatous   Psych: mood appropriate, speech normal Pelvic deferred  NST:  Baseline: 145 Variability: moderate Accelerations: 10x10s present Decelerations: absent Time: Toco: quiet   A/P: 27 y.o. [redacted]w[redacted]d here for antenatal surveillance during pregnancy.  Principle diagnosis: nausea and vomiting in pregnancy  Labor  Not present  Fetal Wellbeing  Reactive NST, reassuring for GA  Nausea and vomiting  1L bolus of LR, with IVF at 135mL/hr to follow  Labs ordered  4mg  IV Zofran q8h ordered  Advance diet as tolerated   Genia Del 06/23/2018 1:17 PM  ----- Genia Del, CNM Certified Nurse Midwife [facility], Department of OB/GYN [facility]

## 2018-06-23 NOTE — Discharge Summary (Signed)
Erica Huerta is a 27 y.o. female. She is at [redacted]w[redacted]d gestation. Patient's last menstrual period was 01/07/2018 (approximate). Estimated Date of Delivery: 09/28/18  Prenatal care site: [facility]  Chief complaint: nausea and vomiting Location: stomach Onset/timing: three days ago Duration: constant Quality: nausea constantly, vomiting whenever she eats Severity: moderate to severe Aggravating or alleviating conditions: none Associated signs/symptoms: none Context: Erica Huerta reports nausea and vomiting that started on Saturday. She had nausea and vomiting in her first trimester of pregnancy, but none since. She reports that she feels nauseous constantly and has vomited anything that she tries to eat. She reports no diarrhea, with last BM yesterday.   S: Resting comfortably.   She reports:  -active fetal movement -no leakage of fluid  -no vaginal bleeding -no contractions  Maternal Medical History:  History reviewed. No pertinent past medical history.  Past Surgical History:  Procedure Laterality Date  . NO PAST SURGERIES      No Known Allergies  Prior to Admission medications   Medication Sig Start Date End Date Taking? Authorizing Provider  metoCLOPramide (REGLAN) 10 MG tablet Take 1 tablet (10 mg total) by mouth every 6 (six) hours as needed for nausea. Patient not taking: Reported on 06/23/2018 02/15/18   Minna Antis, MD     Social History: She  reports that she has never smoked. She has never used smokeless tobacco. She reports that she drinks alcohol. She reports that she does not use drugs.  Family History: family history includes Anxiety disorder in her mother; Depression in her mother; Thyroid disease in her maternal grandmother and mother.   Review of Systems: A full review of systems was performed and negative except as noted in the HPI.     O:  BP 105/63 (BP Location: Left Arm)   Pulse 77   Temp 97.8 F (36.6 C) (Oral)   Resp 14   Ht 5'  7" (1.702 m)   Wt 73 kg   LMP 01/07/2018 (Approximate)   BMI 25.22 kg/m  Results for orders placed or performed during the hospital encounter of 06/23/18 (from the past 48 hour(s))  Comprehensive metabolic panel   Collection Time: 06/23/18  1:12 PM  Result Value Ref Range   Sodium 134 (L) 135 - 145 mmol/L   Potassium 3.6 3.5 - 5.1 mmol/L   Chloride 106 98 - 111 mmol/L   CO2 21 (L) 22 - 32 mmol/L   Glucose, Bld 75 70 - 99 mg/dL   BUN 6 6 - 20 mg/dL   Creatinine, Ser 1.30 (L) 0.44 - 1.00 mg/dL   Calcium 8.8 (L) 8.9 - 10.3 mg/dL   Total Protein 7.0 6.5 - 8.1 g/dL   Albumin 3.5 3.5 - 5.0 g/dL   AST 27 15 - 41 U/L   ALT 16 0 - 44 U/L   Alkaline Phosphatase 78 38 - 126 U/L   Total Bilirubin 0.5 0.3 - 1.2 mg/dL   GFR calc non Af Amer >60 >60 mL/min   GFR calc Af Amer >60 >60 mL/min   Anion gap 7 5 - 15  Lipase, blood   Collection Time: 06/23/18  1:12 PM  Result Value Ref Range   Lipase 37 11 - 51 U/L  CBC on admission   Collection Time: 06/23/18  1:12 PM  Result Value Ref Range   WBC 5.2 4.0 - 10.5 K/uL   RBC 3.53 (L) 3.87 - 5.11 MIL/uL   Hemoglobin 10.7 (L) 12.0 - 15.0 g/dL  HCT 32.6 (L) 36.0 - 46.0 %   MCV 92.4 80.0 - 100.0 fL   MCH 30.3 26.0 - 34.0 pg   MCHC 32.8 30.0 - 36.0 g/dL   RDW 62.1 30.8 - 65.7 %   Platelets 243 150 - 400 K/uL   nRBC 0.0 0.0 - 0.2 %  Amylase   Collection Time: 06/23/18  1:12 PM  Result Value Ref Range   Amylase 107 (H) 28 - 100 U/L  Urinalysis, Complete w Microscopic   Collection Time: 06/23/18  2:22 PM  Result Value Ref Range   Color, Urine STRAW (A) YELLOW   APPearance CLEAR (A) CLEAR   Specific Gravity, Urine 1.005 1.005 - 1.030   pH 8.0 5.0 - 8.0   Glucose, UA NEGATIVE NEGATIVE mg/dL   Hgb urine dipstick NEGATIVE NEGATIVE   Bilirubin Urine NEGATIVE NEGATIVE   Ketones, ur NEGATIVE NEGATIVE mg/dL   Protein, ur NEGATIVE NEGATIVE mg/dL   Nitrite NEGATIVE NEGATIVE   Leukocytes, UA NEGATIVE NEGATIVE   RBC / HPF 0-5 0 - 5 RBC/hpf   WBC,  UA 0-5 0 - 5 WBC/hpf   Bacteria, UA RARE (A) NONE SEEN   Squamous Epithelial / LPF 6-10 0 - 5  Protein / creatinine ratio, urine   Collection Time: 06/23/18  2:22 PM  Result Value Ref Range   Creatinine, Urine 40 mg/dL   Total Protein, Urine <6 mg/dL   Protein Creatinine Ratio        0.00 - 0.15 mg/mg[Cre]     Constitutional: NAD, AAOx3  HE/ENT: extraocular movements grossly intact, moist mucous membranes CV: RRR PULM: normal respiratory effort, CTABL     Abd: gravid, non-tender, non-distended, soft      Ext: Non-tender, Nonedmeatous   Psych: mood appropriate, speech normal Pelvic deferred  NST:  Baseline: 145 Variability: moderate Accelerations: 10x10s present Decelerations: absent Time: Toco: quiet   A/P: 27 y.o. [redacted]w[redacted]d here for antenatal surveillance during pregnancy.  Principle diagnosis: nausea and vomiting in pregnancy  Labor  Not present  Fetal Wellbeing  Reactive NST, reassuring for GA  Nausea and vomiting  S/p 1L bolus of LR with IVF  Labs with no significant abnormalities, reviewed with Dr. Dalbert Huerta  4mg  IV Zofran given  Tolerating regular diet  D/c home stable, precautions reviewed, follow-up as scheduled.   Prescription for Zofran ODT 4mg  q6-8 PRN sent to preferred pharamcy  Next routine appointment on 07/10/18   Erica Huerta 06/23/2018 6:42 PM  ----- Erica Huerta, CNM Certified Nurse Midwife [facility], Department of OB/GYN [facility]

## 2018-07-28 ENCOUNTER — Other Ambulatory Visit: Payer: Self-pay | Admitting: Obstetrics and Gynecology

## 2018-07-28 DIAGNOSIS — Z3689 Encounter for other specified antenatal screening: Secondary | ICD-10-CM

## 2018-07-30 ENCOUNTER — Other Ambulatory Visit: Payer: Self-pay

## 2018-07-30 DIAGNOSIS — Z3689 Encounter for other specified antenatal screening: Secondary | ICD-10-CM

## 2018-08-03 ENCOUNTER — Other Ambulatory Visit: Payer: Self-pay | Admitting: Obstetrics and Gynecology

## 2018-08-03 ENCOUNTER — Ambulatory Visit
Admission: RE | Admit: 2018-08-03 | Discharge: 2018-08-03 | Disposition: A | Discharge status: Home / Self Care | Payer: BLUE CROSS/BLUE SHIELD | Source: Ambulatory Visit | Attending: Maternal & Fetal Medicine | Admitting: Maternal & Fetal Medicine

## 2018-08-03 DIAGNOSIS — Z362 Encounter for other antenatal screening follow-up: Secondary | ICD-10-CM | POA: Diagnosis present

## 2018-08-03 DIAGNOSIS — O365931 Maternal care for other known or suspected poor fetal growth, third trimester, fetus 1: Secondary | ICD-10-CM

## 2018-08-03 DIAGNOSIS — Z3689 Encounter for other specified antenatal screening: Secondary | ICD-10-CM

## 2018-08-03 DIAGNOSIS — Z3A32 32 weeks gestation of pregnancy: Secondary | ICD-10-CM | POA: Diagnosis not present

## 2018-08-03 DIAGNOSIS — O365930 Maternal care for other known or suspected poor fetal growth, third trimester, not applicable or unspecified: Secondary | ICD-10-CM | POA: Insufficient documentation

## 2018-08-06 ENCOUNTER — Other Ambulatory Visit: Payer: Self-pay

## 2018-08-06 DIAGNOSIS — O365990 Maternal care for other known or suspected poor fetal growth, unspecified trimester, not applicable or unspecified: Secondary | ICD-10-CM

## 2018-08-10 ENCOUNTER — Ambulatory Visit
Admission: RE | Admit: 2018-08-10 | Discharge: 2018-08-10 | Disposition: A | Discharge status: Home / Self Care | Payer: BLUE CROSS/BLUE SHIELD | Source: Ambulatory Visit | Attending: Maternal & Fetal Medicine | Admitting: Maternal & Fetal Medicine

## 2018-08-10 DIAGNOSIS — Z3A33 33 weeks gestation of pregnancy: Secondary | ICD-10-CM | POA: Diagnosis not present

## 2018-08-10 DIAGNOSIS — O365930 Maternal care for other known or suspected poor fetal growth, third trimester, not applicable or unspecified: Secondary | ICD-10-CM | POA: Insufficient documentation

## 2018-08-10 DIAGNOSIS — O365990 Maternal care for other known or suspected poor fetal growth, unspecified trimester, not applicable or unspecified: Secondary | ICD-10-CM

## 2018-08-13 ENCOUNTER — Encounter: Payer: Self-pay | Admitting: *Deleted

## 2018-08-13 ENCOUNTER — Other Ambulatory Visit: Payer: Self-pay

## 2018-08-13 ENCOUNTER — Inpatient Hospital Stay
Admission: EM | Admit: 2018-08-13 | Discharge: 2018-08-14 | Disposition: A | Discharge status: Home / Self Care | Payer: BLUE CROSS/BLUE SHIELD | Attending: Obstetrics and Gynecology | Admitting: Obstetrics and Gynecology

## 2018-08-13 ENCOUNTER — Inpatient Hospital Stay: Payer: BLUE CROSS/BLUE SHIELD

## 2018-08-13 DIAGNOSIS — O4693 Antepartum hemorrhage, unspecified, third trimester: Secondary | ICD-10-CM | POA: Diagnosis not present

## 2018-08-13 DIAGNOSIS — Z3A33 33 weeks gestation of pregnancy: Secondary | ICD-10-CM | POA: Diagnosis not present

## 2018-08-13 DIAGNOSIS — N939 Abnormal uterine and vaginal bleeding, unspecified: Secondary | ICD-10-CM | POA: Diagnosis present

## 2018-08-13 DIAGNOSIS — O365990 Maternal care for other known or suspected poor fetal growth, unspecified trimester, not applicable or unspecified: Secondary | ICD-10-CM

## 2018-08-13 MED ORDER — PRENATAL MULTIVITAMIN CH: 1.0000 | ORAL_TABLET | Freq: Every day | ORAL | Status: DC

## 2018-08-13 MED ORDER — DOCUSATE SODIUM 100 MG PO CAPS: 100.0000 mg | ORAL_CAPSULE | Freq: Every day | ORAL | Status: DC

## 2018-08-13 MED ORDER — ZOLPIDEM TARTRATE 5 MG PO TABS: 5.0000 mg | ORAL_TABLET | Freq: Every evening | ORAL | Status: DC | PRN

## 2018-08-13 MED ORDER — ACETAMINOPHEN 325 MG PO TABS: 650.0000 mg | ORAL_TABLET | ORAL | Status: DC | PRN

## 2018-08-13 MED ORDER — CALCIUM CARBONATE ANTACID 500 MG PO CHEW: 2.0000 | CHEWABLE_TABLET | ORAL | Status: DC | PRN

## 2018-08-13 NOTE — ED Triage Notes (Signed)
TRIAGE NOTE to rule out Preterm Labor   History of Present Illness: Erica PeersChelsea L Huerta is a 27 y.o. G1P0000 at 5149w3d presenting to triage for vaginal bleeding x3 days, initially bright red, now spotting. Nickle sized spot on panty liner. No recent intercourse. No contractions. Good fetal movement. No gushes of clear fluid.  Pregnancy c/b fetal growth restriction in 3%, last ultrasound 3 days ago with AFI low but normal at 6cm, and a clear 2x2 pocket. No history of placenta previa or low lying placenta.  Patient Active Problem List   Diagnosis Date Noted  . Nausea/vomiting in pregnancy 06/23/2018  . Encounter for annual routine gynecological examination 07/04/2017    History reviewed. No pertinent past medical history.  Past Surgical History:  Procedure Laterality Date  . NO PAST SURGERIES      OB History  Gravida Para Term Preterm AB Living  1 0 0 0 0 0  SAB TAB Ectopic Multiple Live Births  0 0 0 0 0    # Outcome Date GA Lbr Len/2nd Weight Sex Delivery Anes PTL Lv  1 Current             Social History   Socioeconomic History  . Marital status: Single    Spouse name: Not on file  . Number of children: Not on file  . Years of education: Not on file  . Highest education level: Not on file  Occupational History  . Occupation: Nature conservation officerperations Manager  Social Needs  . Financial resource strain: Not hard at all  . Food insecurity:    Worry: Never true    Inability: Never true  . Transportation needs:    Medical: No    Non-medical: No  Tobacco Use  . Smoking status: Never Smoker  . Smokeless tobacco: Never Used  Substance and Sexual Activity  . Alcohol use: Not Currently    Frequency: Never    Comment: occasional  . Drug use: No  . Sexual activity: Yes    Birth control/protection: Condom  Lifestyle  . Physical activity:    Days per week: 0 days    Minutes per session: 0 min  . Stress: Not at all  Relationships  . Social connections:    Talks on phone: More than  three times a week    Gets together: More than three times a week    Attends religious service: Never    Active member of club or organization: No    Attends meetings of clubs or organizations: Never    Relationship status: Never married  Other Topics Concern  . Not on file  Social History Narrative  . Not on file    Family History  Problem Relation Age of Onset  . Anxiety disorder Mother   . Depression Mother   . Thyroid disease Mother   . Thyroid disease Maternal Grandmother     No Known Allergies  Medications Prior to Admission  Medication Sig Dispense Refill Last Dose  . Prenatal Vit-Fe Fumarate-FA (PRENATAL MULTIVITAMIN) TABS tablet Take 1 tablet by mouth daily at 12 noon.       Review of Systems - See HPI for OB specific ROS.   Vitals:  BP 108/67 (BP Location: Left Arm)   Pulse 90   Temp 98.6 F (37 C) (Oral)   Resp 16   Ht 5\' 7"  (1.702 m)   Wt 78 kg   LMP 01/07/2018 (Approximate)   BMI 26.94 kg/m  Physical Examination: CONSTITUTIONAL: Well-developed, well-nourished female in no  acute distress.  ABDOMEN: Soft, nontender, nondistended, gravid.  Cervix: deferred Membranes:intact Fetal Monitoring:Baseline: 145 bpm, Variability: Good {> 6 bpm), Accelerations: Reactive and Decelerations: Absent Tocometer: Flat  Labs:  No results found for this or any previous visit (from the past 24 hour(s)).  Imaging Studies: [place] Mfm Fetal Bpp W/nonstress  Result Date: 08/10/2018 ----------------------------------------------------------------------  OBSTETRICS REPORT                       (Signed Final 08/10/2018 02:19 pm) ---------------------------------------------------------------------- PATIENT INFO:  ID #:       [id number]                          D.O.B.:  07/07/1991 (27 yrs)  Name:       Erica Huerta              Visit Date: 08/10/2018 01:46 pm ---------------------------------------------------------------------- PERFORMED BY:  Performed By:     Bridgette Habermann         Referred By:      Melonie Florida ---------------------------------------------------------------------- SERVICE(S) PROVIDED:   [place] MFM FETAL BPP W/NONSTRESS                         78295.6  ---------------------------------------------------------------------- INDICATIONS:   [redacted] weeks gestation of pregnancy                Z3A.33  ---------------------------------------------------------------------- FETAL EVALUATION:  Num Of Fetuses:         1  Fetal Heart Rate(bpm):  155  Cardiac Activity:       Present  Presentation:           Cephalic  Placenta:               Anterior  AFI Sum(cm)     %Tile       Largest Pocket(cm)  6.67            < 3         3.1  RUQ(cm)       RLQ(cm)       LUQ(cm)        LLQ(cm)  2.44          0             1.13           3.1 ---------------------------------------------------------------------- BIOPHYSICAL EVALUATION:  Amniotic F.V:   Within normal limits       F. Tone:        Observed  F. Movement:    Observed                   Score:          8/8  F. Breathing:   Observed ---------------------------------------------------------------------- GESTATIONAL AGE:  LMP:           30w 5d        Date:  01/07/18                 EDD:   10/14/18  Clinical EDD:  33w 0d  EDD:   09/28/18  Best:          33w 0d     Det. By:  Clinical EDD             EDD:   09/28/18 ---------------------------------------------------------------------- DOPPLER - FETAL VESSELS:  Umbilical Artery   S/D     %tile     RI    %tile                     PSV                                                   (cm/s)   3.5       90   0.71        90                     35.3 ---------------------------------------------------------------------- IMPRESSION:  ,  Thank you for referring your patient for antental testing due to  fetal growth restriction (efw 1460 (3%) on 08/03/18).  There is a singleton gestation at 33 weeks with normal   amniotic fluid volume.  Dating is by earliest available  ultrasound performed at [facility] on 04/02/18;  measurments were ocnsistent iwth 14 weeks 3 days.  The BPP was noted to be 8/8, which was reassuring.  The  umbilical artery doppler is 3.5 (normal).  The amniotic fluid  volume is 6.7cm  (2x2 pocket present).  Continue twice weeky antental testing and weekly dopplers  (she has appointment scheduled). Follow up growth every 3  weeks.  Thank you for allowing [place] to participate in your patient's care.  Please do not hesitate to contact us if we can be of further  assistance. ----------------------------------------------------------------------                   Consuelo Pandy, MD Electronically Signed Final Report   08/10/2018 02:19 pm ----------------------------------------------------------------------  [place] Mfm Ob Detail +14 Wk  Result Date: 08/03/2018 ----------------------------------------------------------------------  OBSTETRICS REPORT                       (Signed Final 08/03/2018 03:57 pm) ---------------------------------------------------------------------- PATIENT INFO:  ID #:       [id number]                          D.O.B.:  [birth year] (27 yrs)  Name:       Erica Huerta              Visit Date: 08/03/2018 02:32 pm ---------------------------------------------------------------------- PERFORMED BY:  Performed By:     Loretha Brasil       Referred By:      Genia Del ---------------------------------------------------------------------- SERVICE(S) PROVIDED:   [place] MFM OB DETAIL +14 WK                              76811.01  ----------------------------------------------------------------------  INDICATIONS:   [redacted] weeks gestation of pregnancy                Z3A.32  ---------------------------------------------------------------------- FETAL EVALUATION:  Num Of Fetuses:         1  Fetal Heart Rate(bpm):  145  Cardiac Activity:        Present  Presentation:           Vertex  Placenta:               Anterior Grade , No previa  AFI Sum(cm)     %Tile       Largest Pocket(cm)  13.36           42          5.3  RUQ(cm)       RLQ(cm)       LUQ(cm)        LLQ(cm)  1.79          3.54          2.73           5.3 ---------------------------------------------------------------------- BIOMETRY:  BPD:      83.7  mm     G. Age:  33w 5d         86  %    CI:        82.91   %    70 - 86                                                          FL/HC:      20.0   %    19.1 - 21.3  HC:      289.9  mm     G. Age:  31w 6d         14  %    HC/AC:      1.19        0.96 - 1.17  AC:      243.2  mm     G. Age:  28w 4d        < 3  %    FL/BPD:     69.2   %    71 - 87  FL:       57.9  mm     G. Age:  30w 2d          6  %    FL/AC:      23.8   %    20 - 24  HUM:      52.7  mm     G. Age:  30w 5d         24  %  NFT:       4.1  mm  CM:        7.3  mm  Est. FW:    1460  gm      3 lb 3 oz    < 3  % ---------------------------------------------------------------------- GESTATIONAL AGE:  LMP:           29w 5d        Date:  01/07/18                 EDD:   10/14/18  Clinical EDD:  Armida Sans32w 0d  EDD:   09/28/18  U/S Today:     31w 1d                                        EDD:   10/04/18  Best:          32w 0d     Det. By:  Clinical EDD             EDD:   09/28/18 ---------------------------------------------------------------------- ANATOMY:  Cranium:               Within Normal Limits   Aortic Arch:            Normal appearance  Cavum:                 CSP visualized         Ductal Arch:            Normal appearance  Ventricles:            Normal appearance      Diaphragm:              Within Normal Limits  Choroid Plexus:        Within Normal Limits   Stomach:                Seen  Cerebellum:            Within Normal Limits   Abdomen:                Within Normal                                                                        Limits  Posterior  Fossa:       Within Normal Limits   Abdominal Wall:         Normal appearance  Nuchal Fold:           Within Normal Limits   Cord Vessels:           3 vessels  Face:                  Suboptimal             Kidneys:                Normal appearance  Lips:                  Normal appearance      Bladder:                Seen  Thoracic:              Within Normal Limits   Spine:                  Normal appearance  Heart:                 4-Chamber view         Upper Extremities:      Visualized  appears normal  RVOT:                  Normal appearance      Lower Extremities:      Visualized  LVOT:                  Normal appearance ---------------------------------------------------------------------- DOPPLER - FETAL VESSELS:  Umbilical Artery   S/D  3.47 ---------------------------------------------------------------------- CERVIX UTERUS ADNEXA:  Cervix  Length:           3.95  cm. ---------------------------------------------------------------------- IMPRESSION:  Dear Dr.   Particia Nearing,  Thank you for referring your patient for detailed anatomic  survey and fetal growth assessment due to new onset FGR.  Ultrasound demonstrates a single, live fetus at 32 weeks 0  days.  Dating is by earliest available ultrasound performed at  [facility] on 04/02/18; measurements were consistent  with 14w 3 d (EDD 09/28/18)  Detailed evaluation of the fetal anatomy was performed.  The  fetal anatomical survey appears within normal limits within  the resolution of ultrasound as described above.  Nonetheless, all anatomical structures could not be  adequately visualized due to the early gestational age.  The estimated fetal weight is 1460g (<3rd percentile)  The  umbilical dopplers are normal and the amniotic fluid volume  is normal.  BPP=8/8.  Fetal growth restriction is present today.  We reviewed some  of the risk factors---maternal exposures such as tobacco (she  does not smoke) and chronic conditions (eg  hypertension),  as well as medications (she does not have medication  exposures associated wtih FGR), as well as infectious  processes and aneuploidy.  We did not see sonographic  features associated with aneuploidy or overt infectious  processes.  We addressed the association of FGR with  uteroplacental insufficiency and the need for closer fetal  surveillance.  Recommend twice weekly testing with weekly nst or bpp and  afi/dopplers (we have scheduled appointments here for the  next 3 weeks) alternating with weekly NST (can be performed  in your office--she has an appointment later this week).  We  will reassess fetal growth in 3 weeks.  If growth continues at  less than the 3rd percentile, we would recommend delivery in  the 37th week.  Delivery sooner would be indicated if testing  is nonreassuring.  Thank you for allowing [place] to participate in your patient's care. ----------------------------------------------------------------------                   Consuelo Pandy, MD Electronically Signed Final Report   08/03/2018 03:57 pm ----------------------------------------------------------------------    Assessment and Plan: Patient Active Problem List   Diagnosis Date Noted  . Nausea/vomiting in pregnancy 06/23/2018  . Encounter for annual routine gynecological examination 07/04/2017    1. Reassuring fetal monitoring. Will get ultrasound for placental, fluid and cervical evaluation

## 2018-08-13 NOTE — OB Triage Note (Signed)
Pt states she has had spotting since Tuesday, bright red initially, which is now pink. She is wearing a panty liner and says she has nickel size spots and changes her panty liner about once a day. She has not had intercourse. Positive fetal movement, no other leaking of fluid or contractions. She added that she has ultrasounds every Monday for IUGR, and her fluid level was normal this past Monday.

## 2018-08-14 DIAGNOSIS — O4693 Antepartum hemorrhage, unspecified, third trimester: Secondary | ICD-10-CM | POA: Diagnosis not present

## 2018-08-14 NOTE — Discharge Summary (Signed)
Pt returned from ultrasound - final read pending. However, no further bleeding on pad since arrival.  Ultrasound with normal anterior placenta, edge 9cm from internal os. No evidence of placental abruption.  AFI 6.3, in keeping with scan 4 days ago.  Pt discharged home with precautions.

## 2018-08-14 NOTE — Discharge Instructions (Signed)
Vaginal Bleeding During Pregnancy, Third Trimester ° °A small amount of bleeding from the vagina (spotting) is relatively common during pregnancy. Various things can cause bleeding or spotting during pregnancy. Sometimes bleeding is normal and is not a problem. However, bleeding during the third trimester can also be a sign of something serious for the mother and the baby. Be sure to tell your health care provider about any vaginal bleeding right away. °Some possible causes of vaginal bleeding during the third trimester include: °· Infection or growths (polyps) on the cervix. °· A condition in which the placenta partially or completely covers the opening of the cervix inside the uterus (placenta previa). °· The placenta separating from the uterus (placenta abruption). °· The start of labor (discharging of the mucus plug). °· A condition in which the placenta grows into the muscle layer of the uterus (placenta accreta). °Follow these instructions at home: °Activity °· Follow instructions from your health care provider about limiting your activity. If your health care provider recommends activity restriction, you may need to stay in bed and only get up to use the bathroom. In some cases, your health care provider may allow you to continue light activity. °· If needed, make plans for someone to help with your regular activities. °· Ask your health care provider if it is safe for you to drive. °· Do not lift anything that is heavier than 10 lb (4.5 kg), or the limit that your health care provider tells you, until he or she says that it is safe. °· Do not have sex or orgasms until your health care provider says that this is safe. °Medicines °· Take over-the-counter and prescription medicines only as told by your health care provider. °· Do not take aspirin because it can cause bleeding. °General instructions °· Pay attention to any changes in your symptoms. °· Write down how many pads you use each day, how often you  change pads, and how soaked (saturated) they are. °· Do not use tampons or douche. °· If you pass any tissue from your vagina, save the tissue so you can show it to your health care provider. °· Keep all follow-up visits as told by your health care provider. This is important. °Contact a health care provider if: °· You have vaginal bleeding during any part of your pregnancy. °· You have cramps or labor pains. °· You have a fever. °Get help right away if: °· You have severe cramps or pain in your back or abdomen. °· You have a gush of fluid from the vagina. °· You pass large clots or a large amount of tissue from your vagina. °· Your bleeding increases. °· You feel light-headed or weak. °· You faint. °· You feel that your baby is moving less than usual, or not moving at all. °Summary °· Various things can cause bleeding or spotting in pregnancy. °· Bleeding during the third trimester can be a sign of a serious problem for the mother and the baby. °· Be sure to tell your health care provider about any vaginal bleeding right away. °This information is not intended to replace advice given to you by your health care provider. Make sure you discuss any questions you have with your health care provider. °Document Released: 10/26/2002 Document Revised: 11/07/2016 Document Reviewed: 11/07/2016 °Elsevier Interactive Patient Education © 2019 [employer] ° °

## 2018-08-17 ENCOUNTER — Other Ambulatory Visit: Payer: Self-pay

## 2018-08-17 ENCOUNTER — Ambulatory Visit
Admission: RE | Admit: 2018-08-17 | Discharge: 2018-08-17 | Disposition: A | Discharge status: Home / Self Care | Payer: BLUE CROSS/BLUE SHIELD | Source: Ambulatory Visit | Attending: Obstetrics and Gynecology | Admitting: Obstetrics and Gynecology

## 2018-08-17 DIAGNOSIS — O365990 Maternal care for other known or suspected poor fetal growth, unspecified trimester, not applicable or unspecified: Secondary | ICD-10-CM

## 2018-08-17 DIAGNOSIS — O365931 Maternal care for other known or suspected poor fetal growth, third trimester, fetus 1: Secondary | ICD-10-CM | POA: Insufficient documentation

## 2018-08-17 DIAGNOSIS — Z3A34 34 weeks gestation of pregnancy: Secondary | ICD-10-CM | POA: Diagnosis not present

## 2018-08-24 ENCOUNTER — Ambulatory Visit
Admission: RE | Admit: 2018-08-24 | Discharge: 2018-08-24 | Disposition: A | Discharge status: Home / Self Care | Payer: BLUE CROSS/BLUE SHIELD | Source: Ambulatory Visit | Attending: Obstetrics and Gynecology | Admitting: Obstetrics and Gynecology

## 2018-08-24 ENCOUNTER — Other Ambulatory Visit: Payer: Self-pay | Admitting: Obstetrics and Gynecology

## 2018-08-24 ENCOUNTER — Other Ambulatory Visit: Payer: Self-pay

## 2018-08-24 DIAGNOSIS — O365930 Maternal care for other known or suspected poor fetal growth, third trimester, not applicable or unspecified: Secondary | ICD-10-CM | POA: Insufficient documentation

## 2018-08-24 DIAGNOSIS — Z3A35 35 weeks gestation of pregnancy: Secondary | ICD-10-CM | POA: Insufficient documentation

## 2018-08-24 DIAGNOSIS — O365990 Maternal care for other known or suspected poor fetal growth, unspecified trimester, not applicable or unspecified: Secondary | ICD-10-CM

## 2018-08-24 LAB — OB RESULTS CONSOLE GBS: GBS: POSITIVE

## 2018-08-26 ENCOUNTER — Observation Stay
Admission: EM | Admit: 2018-08-26 | Discharge: 2018-08-26 | Disposition: A | Discharge status: Home / Self Care | Payer: BLUE CROSS/BLUE SHIELD | Attending: Obstetrics and Gynecology | Admitting: Obstetrics and Gynecology

## 2018-08-26 ENCOUNTER — Other Ambulatory Visit: Payer: Self-pay

## 2018-08-26 ENCOUNTER — Encounter: Payer: Self-pay | Admitting: Obstetrics and Gynecology

## 2018-08-26 DIAGNOSIS — R1030 Lower abdominal pain, unspecified: Secondary | ICD-10-CM | POA: Insufficient documentation

## 2018-08-26 DIAGNOSIS — O26893 Other specified pregnancy related conditions, third trimester: Secondary | ICD-10-CM | POA: Diagnosis present

## 2018-08-26 DIAGNOSIS — Z3A35 35 weeks gestation of pregnancy: Secondary | ICD-10-CM | POA: Insufficient documentation

## 2018-08-26 DIAGNOSIS — O4703 False labor before 37 completed weeks of gestation, third trimester: Secondary | ICD-10-CM | POA: Diagnosis present

## 2018-08-26 LAB — WET PREP, GENITAL
Clue Cells Wet Prep HPF POC: NONE SEEN
Sperm: NONE SEEN
Trich, Wet Prep: NONE SEEN
Yeast Wet Prep HPF POC: NONE SEEN

## 2018-08-26 LAB — URINALYSIS, ROUTINE W REFLEX MICROSCOPIC
Bilirubin Urine: NEGATIVE
Glucose, UA: NEGATIVE mg/dL
HGB URINE DIPSTICK: NEGATIVE
Ketones, ur: NEGATIVE mg/dL
Leukocytes, UA: NEGATIVE
Nitrite: NEGATIVE
Protein, ur: NEGATIVE mg/dL
Specific Gravity, Urine: 1.01 (ref 1.005–1.030)
pH: 7 (ref 5.0–8.0)

## 2018-08-26 LAB — URINE CULTURE: Culture: 1000 — AB

## 2018-08-26 LAB — CHLAMYDIA/NGC RT PCR (ARMC ONLY)
Chlamydia Tr: NOT DETECTED
N gonorrhoeae: NOT DETECTED

## 2018-08-26 LAB — GROUP B STREP BY PCR: Group B strep by PCR: POSITIVE — AB

## 2018-08-26 LAB — OB RESULTS CONSOLE GC/CHLAMYDIA
Chlamydia: NEGATIVE
Gonorrhea: NEGATIVE

## 2018-08-26 MED ORDER — LACTATED RINGERS IV SOLN
INTRAVENOUS | Status: DC
  Administered 2018-08-26: 18:00:00 via INTRAVENOUS

## 2018-08-26 MED ORDER — ONDANSETRON HCL 4 MG/2ML IJ SOLN
4.0000 mg | Freq: Four times a day (QID) | INTRAMUSCULAR | Status: DC | PRN
  Administered 2018-08-26: 4 mg via INTRAVENOUS

## 2018-08-26 MED ORDER — ONDANSETRON HCL 4 MG/2ML IJ SOLN
INTRAMUSCULAR | Status: AC
  Administered 2018-08-26: 4 mg via INTRAVENOUS
  Filled 2018-08-26: qty 2

## 2018-08-26 MED ORDER — ONDANSETRON 4 MG PO TBDP: 4.0000 mg | ORAL_TABLET | Freq: Four times a day (QID) | ORAL | 0 refills | Status: DC | PRN

## 2018-08-26 NOTE — Discharge Instructions (Signed)

## 2018-08-26 NOTE — Discharge Summary (Signed)
[name] is a 28 y.o. female. She is at 2165w2d gestation. Patient's last menstrual period was 01/07/2018 (approximate). Estimated Date of Delivery: 09/28/18  [facility]    Current pregnancy complicated by:  Asymmetric growth restriction- planned IOL at 37+0wks  Chief complaint: abdominal pain for 2-3hrs  Location: lower abdominal pain Onset/timing: pain started about 2hrs ago while at work  Duration: intermittent pains coming and going  Quality: like bad menstrual cramps  Severity:  Aggravating or alleviating conditions:  Associated signs/symptoms: pain increased then had an urgent BM- not diarrhea, and increased nausea, has thrown up once.  Noted bleeding after her BM but thinks it might be from a hemorrhoid.  Context: no recent intercourse.   S: Resting but appears uncomfortable.  no VB. no LOF  Active fetal movement. Denies: HA, visual changes, SOB, or RUQ/epigastric pain  Maternal Medical History:  History reviewed. No pertinent past medical history.  Past Surgical History:  Procedure Laterality Date  . NO PAST SURGERIES      No Known Allergies  Prior to Admission medications   Medication Sig Start Date End Date Taking? Authorizing Provider  Prenatal Vit-Fe Fumarate-FA (PRENATAL MULTIVITAMIN) TABS tablet Take 1 tablet by mouth daily at 12 noon.   Yes [provider]      Social History: She  reports that she has never smoked. She has never used smokeless tobacco. She reports previous alcohol use. She reports that she does not use drugs.  Family History: family history includes Anxiety disorder in her mother; Depression in her mother; Thyroid disease in her mother.   Review of Systems: A full review of systems was performed and negative except as noted in the HPI.     O:  BP 109/72 (BP Location: Right Arm)   Pulse 76   Temp 98.1 F (36.7 C) (Oral)   Resp 18   Ht 5\' 7"  (1.702 m)   Wt 78 kg   LMP 01/07/2018  (Approximate)   BMI 26.94 kg/m  Results for orders placed or performed during the hospital encounter of 08/26/18 (from the past 48 hour(s))  Wet prep, genital   Collection Time: 08/26/18  5:42 PM  Result Value Ref Range   Yeast Wet Prep HPF POC NONE SEEN NONE SEEN   Trich, Wet Prep NONE SEEN NONE SEEN   Clue Cells Wet Prep HPF POC NONE SEEN NONE SEEN   WBC, Wet Prep HPF POC FEW (A) NONE SEEN   Sperm NONE SEEN   Group B strep by PCR   Collection Time: 08/26/18  5:42 PM  Result Value Ref Range   Group B strep by PCR POSITIVE (A) NEGATIVE  Urinalysis, Routine w reflex microscopic   Collection Time: 08/26/18  5:43 PM  Result Value Ref Range   Color, Urine YELLOW (A) YELLOW   APPearance CLEAR (A) CLEAR   Specific Gravity, Urine 1.010 1.005 - 1.030   pH 7.0 5.0 - 8.0   Glucose, UA NEGATIVE NEGATIVE mg/dL   Hgb urine dipstick NEGATIVE NEGATIVE   Bilirubin Urine NEGATIVE NEGATIVE   Ketones, ur NEGATIVE NEGATIVE mg/dL   Protein, ur NEGATIVE NEGATIVE mg/dL   Nitrite NEGATIVE NEGATIVE   Leukocytes, UA NEGATIVE NEGATIVE     Constitutional: NAD, AAOx3  HE/ENT: extraocular movements grossly intact, moist mucous membranes CV: RRR PULM: nl respiratory effort, CTABL     Abd: gravid, non-tender, non-distended, soft      Ext: Non-tender, Nonedematous   Psych: mood appropriate, speech normal  Pelvic: SSE done  Pelvic exam: scant white adherent vaginal discharge. No bleeding, no pooling. No vaginal erythema. GC/Ct and Wet prep.   Fetal  monitoring: Cat I Appropriate for GA Baseline: 135bpm Variability: moderate Accelerations:  present x >2 Decelerations absent  Toco: uterine irritability noted on toco initially that resolved after IV bolus.    A/P: 28 y.o. 2477w2d here for antenatal surveillance  Principle Diagnosis: Preterm contractions, 3rd trimester; [redacted]wks gestation   IV hydration with LR, IV zofran given. Pt feeling better after iv bolus and zofran.   GC/CT and wet prep  obtained.   GBS noted + urine culture on 08/24/18  Fetal Wellbeing: Reassuring Cat 1 tracing with Reactive NST   D/c home stable, precautions reviewed, [facility].   Work excuse note given.   Planned IOL scheduled for 37wks on 09/07/18 at 0001.    Randa NgoRebecca A Lakyia Huerta, CNM 08/26/2018  7:09 PM

## 2018-08-27 ENCOUNTER — Other Ambulatory Visit: Payer: Self-pay | Admitting: Obstetrics and Gynecology

## 2018-08-27 ENCOUNTER — Other Ambulatory Visit: Payer: Self-pay

## 2018-08-27 DIAGNOSIS — O365990 Maternal care for other known or suspected poor fetal growth, unspecified trimester, not applicable or unspecified: Secondary | ICD-10-CM

## 2018-08-27 DIAGNOSIS — Z3689 Encounter for other specified antenatal screening: Secondary | ICD-10-CM

## 2018-08-27 DIAGNOSIS — O365931 Maternal care for other known or suspected poor fetal growth, third trimester, fetus 1: Secondary | ICD-10-CM

## 2018-08-31 ENCOUNTER — Inpatient Hospital Stay: Admission: RE | Admit: 2018-08-31 | Payer: BLUE CROSS/BLUE SHIELD | Source: Ambulatory Visit

## 2018-08-31 ENCOUNTER — Other Ambulatory Visit: Payer: Self-pay | Admitting: Obstetrics and Gynecology

## 2018-08-31 NOTE — Progress Notes (Unsigned)
GBS positive in urine- needs treatment in labor   low colony count - Rx if sxs  Erica RalphLivingston, Erica Huerta

## 2018-09-05 ENCOUNTER — Other Ambulatory Visit: Payer: Self-pay | Admitting: Obstetrics and Gynecology

## 2018-09-05 NOTE — Progress Notes (Signed)
IOL admission orders  

## 2018-09-06 ENCOUNTER — Inpatient Hospital Stay
Admission: EM | Admit: 2018-09-06 | Discharge: 2018-09-11 | DRG: 787 | Disposition: A | Discharge status: Home / Self Care | Payer: BLUE CROSS/BLUE SHIELD | Attending: Obstetrics and Gynecology | Admitting: Obstetrics and Gynecology

## 2018-09-06 DIAGNOSIS — O9081 Anemia of the puerperium: Secondary | ICD-10-CM | POA: Diagnosis not present

## 2018-09-06 DIAGNOSIS — I959 Hypotension, unspecified: Secondary | ICD-10-CM | POA: Diagnosis present

## 2018-09-06 DIAGNOSIS — Z98891 History of uterine scar from previous surgery: Secondary | ICD-10-CM

## 2018-09-06 DIAGNOSIS — O99824 Streptococcus B carrier state complicating childbirth: Secondary | ICD-10-CM | POA: Diagnosis present

## 2018-09-06 DIAGNOSIS — O365930 Maternal care for other known or suspected poor fetal growth, third trimester, not applicable or unspecified: Principal | ICD-10-CM | POA: Diagnosis present

## 2018-09-06 DIAGNOSIS — D62 Acute posthemorrhagic anemia: Secondary | ICD-10-CM | POA: Diagnosis not present

## 2018-09-06 DIAGNOSIS — Z3A37 37 weeks gestation of pregnancy: Secondary | ICD-10-CM

## 2018-09-07 ENCOUNTER — Other Ambulatory Visit: Payer: Self-pay

## 2018-09-07 DIAGNOSIS — O365930 Maternal care for other known or suspected poor fetal growth, third trimester, not applicable or unspecified: Secondary | ICD-10-CM | POA: Diagnosis present

## 2018-09-07 DIAGNOSIS — D62 Acute posthemorrhagic anemia: Secondary | ICD-10-CM | POA: Diagnosis not present

## 2018-09-07 DIAGNOSIS — O99824 Streptococcus B carrier state complicating childbirth: Secondary | ICD-10-CM | POA: Diagnosis present

## 2018-09-07 DIAGNOSIS — I959 Hypotension, unspecified: Secondary | ICD-10-CM | POA: Diagnosis present

## 2018-09-07 DIAGNOSIS — Z3A37 37 weeks gestation of pregnancy: Secondary | ICD-10-CM | POA: Diagnosis not present

## 2018-09-07 DIAGNOSIS — O9081 Anemia of the puerperium: Secondary | ICD-10-CM | POA: Diagnosis not present

## 2018-09-07 LAB — CBC
HCT: 29.4 % — ABNORMAL LOW (ref 36.0–46.0)
Hemoglobin: 9.8 g/dL — ABNORMAL LOW (ref 12.0–15.0)
MCH: 30.2 pg (ref 26.0–34.0)
MCHC: 33.3 g/dL (ref 30.0–36.0)
MCV: 90.5 fL (ref 80.0–100.0)
Platelets: 245 10*3/uL (ref 150–400)
RBC: 3.25 MIL/uL — ABNORMAL LOW (ref 3.87–5.11)
RDW: 12.8 % (ref 11.5–15.5)
WBC: 5.9 10*3/uL (ref 4.0–10.5)
nRBC: 0 % (ref 0.0–0.2)

## 2018-09-07 LAB — TYPE AND SCREEN
ABO/RH(D): B POS
Antibody Screen: NEGATIVE

## 2018-09-07 MED ORDER — LACTATED RINGERS IV SOLN
INTRAVENOUS | Status: DC
  Administered 2018-09-07 – 2018-09-08 (×6): via INTRAVENOUS

## 2018-09-07 MED ORDER — SODIUM CHLORIDE 0.9 % IV SOLN
5.0000 10*6.[IU] | Freq: Once | INTRAVENOUS | Status: AC
  Administered 2018-09-07: 5 10*6.[IU] via INTRAVENOUS
  Filled 2018-09-07: qty 5

## 2018-09-07 MED ORDER — MISOPROSTOL 200 MCG PO TABS
ORAL_TABLET | ORAL | Status: AC
  Administered 2018-09-07: 25 ug via VAGINAL
  Filled 2018-09-07: qty 4

## 2018-09-07 MED ORDER — ONDANSETRON HCL 4 MG/2ML IJ SOLN
4.0000 mg | Freq: Four times a day (QID) | INTRAMUSCULAR | Status: DC | PRN
  Administered 2018-09-08: 4 mg via INTRAVENOUS
  Filled 2018-09-07: qty 2

## 2018-09-07 MED ORDER — LIDOCAINE HCL (PF) 1 % IJ SOLN
INTRAMUSCULAR | Status: AC
  Filled 2018-09-07: qty 30

## 2018-09-07 MED ORDER — SOD CITRATE-CITRIC ACID 500-334 MG/5ML PO SOLN
30.0000 mL | ORAL | Status: DC | PRN
  Administered 2018-09-08: 30 mL via ORAL
  Filled 2018-09-07: qty 15

## 2018-09-07 MED ORDER — OXYTOCIN 40 UNITS IN NORMAL SALINE INFUSION - SIMPLE MED
2.5000 [IU]/h | INTRAVENOUS | Status: DC
  Filled 2018-09-07: qty 1000

## 2018-09-07 MED ORDER — OXYTOCIN 40 UNITS IN NORMAL SALINE INFUSION - SIMPLE MED
1.0000 m[IU]/min | INTRAVENOUS | Status: DC
  Administered 2018-09-07: 1 m[IU]/min via INTRAVENOUS
  Administered 2018-09-07: 2 m[IU]/min via INTRAVENOUS
  Administered 2018-09-08: 300 mL via INTRAVENOUS

## 2018-09-07 MED ORDER — ACETAMINOPHEN 325 MG PO TABS: 650.0000 mg | ORAL_TABLET | ORAL | Status: DC | PRN

## 2018-09-07 MED ORDER — BUTORPHANOL TARTRATE 2 MG/ML IJ SOLN
1.0000 mg | INTRAMUSCULAR | Status: DC | PRN
  Administered 2018-09-08 (×6): 1 mg via INTRAVENOUS
  Filled 2018-09-07 (×6): qty 1

## 2018-09-07 MED ORDER — TERBUTALINE SULFATE 1 MG/ML IJ SOLN
0.2500 mg | Freq: Once | INTRAMUSCULAR | Status: AC | PRN
  Administered 2018-09-07: 0.25 mg via SUBCUTANEOUS
  Filled 2018-09-07: qty 1

## 2018-09-07 MED ORDER — OXYTOCIN BOLUS FROM INFUSION: 500.0000 mL | Freq: Once | INTRAVENOUS | Status: DC

## 2018-09-07 MED ORDER — LIDOCAINE HCL (PF) 1 % IJ SOLN: 30.0000 mL | INTRAMUSCULAR | Status: DC | PRN

## 2018-09-07 MED ORDER — SODIUM CHLORIDE (PF) 0.9 % IJ SOLN
INTRAMUSCULAR | Status: AC
  Filled 2018-09-07: qty 50

## 2018-09-07 MED ORDER — MISOPROSTOL 25 MCG QUARTER TABLET
25.0000 ug | ORAL_TABLET | ORAL | Status: DC | PRN
  Administered 2018-09-07: 25 ug via VAGINAL
  Filled 2018-09-07: qty 1

## 2018-09-07 MED ORDER — MISOPROSTOL 25 MCG QUARTER TABLET
25.0000 ug | ORAL_TABLET | ORAL | Status: DC | PRN
  Administered 2018-09-07: 25 ug via BUCCAL
  Filled 2018-09-07: qty 1

## 2018-09-07 MED ORDER — OXYTOCIN 10 UNIT/ML IJ SOLN
INTRAMUSCULAR | Status: AC
  Filled 2018-09-07: qty 2

## 2018-09-07 MED ORDER — LACTATED RINGERS IV SOLN
500.0000 mL | INTRAVENOUS | Status: DC | PRN
  Administered 2018-09-07 (×3): 500 mL via INTRAVENOUS

## 2018-09-07 MED ORDER — OXYTOCIN 40 UNITS IN NORMAL SALINE INFUSION - SIMPLE MED
INTRAVENOUS | Status: AC
  Administered 2018-09-07: 1 m[IU]/min via INTRAVENOUS
  Filled 2018-09-07: qty 1000

## 2018-09-07 MED ORDER — AMMONIA AROMATIC IN INHA
RESPIRATORY_TRACT | Status: AC
  Filled 2018-09-07: qty 10

## 2018-09-07 MED ORDER — PENICILLIN G 3 MILLION UNITS IVPB - SIMPLE MED
3.0000 10*6.[IU] | INTRAVENOUS | Status: DC
  Administered 2018-09-07 – 2018-09-08 (×8): 3 10*6.[IU] via INTRAVENOUS
  Filled 2018-09-07: qty 3
  Filled 2018-09-07 (×5): qty 100
  Filled 2018-09-07 (×3): qty 3
  Filled 2018-09-07 (×10): qty 100

## 2018-09-07 NOTE — Progress Notes (Signed)
Strip review.  FHT: 140 mod + accels +variable vs late decels TOCO: poorly traced  Foley bulb in place, on pitocin  BP (!) 95/57   Pulse 94   Temp (!) 97.5 F (36.4 C)   Resp 16   Ht 5\' 7"  (1.702 m)   Wt 78 kg   LMP 01/07/2018 (Approximate)   BMI 26.94 kg/m   Still hypotensive, but asymptomatic. Occasional decels, but with accelerations. Continue pitocin and induction Maintain foley bulb. Discussed w RN  ----- Ranae Plumber, MD Attending Obstetrician and Gynecologist [facility], Department of OB/GYN [facility]

## 2018-09-07 NOTE — Progress Notes (Signed)
Intrapartum progress note  Assumed care from R. McVey, CNM  Induction at 37+0 for asymmetric FGR, as recommended by MFM. Cytotec given and subsequent tachysystole,with a couple variables and terbutaline prior to my arrival, at 07:15.  Otherwise category 1 tracing.  Currently:  Patient comfortable  BP (!) 88/41 (BP Location: Right Arm)   Pulse 91   Temp 98.1 F (36.7 C) (Oral)   Resp 16   Ht 5\' 7"  (1.702 m)   Wt 78 kg   LMP 01/07/2018 (Approximate)   BMI 26.94 kg/m   FHT:  140 mod + accels no decels for the last 30 mins TOCO;  Poorly traced, seems to be q22min SVE:   A/P: 27yo G1P0 @ 37+ 0 with IOL for asymmetric FGR   1. IUP: category 2, now category 1 after terb for tachysystole 2. Hypotension:  Need to recheck BP and if still low consider fluid bolus. 3. IOL: decels occurred with hypotension and tachysystole.  Will attempt cervical ripening with pitocin and when possible, will place foley bulb.   ----- Ranae Plumber, MD Attending Obstetrician and Gynecologist [facility], Department of OB/GYN [facility]

## 2018-09-07 NOTE — Progress Notes (Signed)
Strip review  Called by RN to report repetitive late declarations. BP 113/73   Pulse 88   Temp (!) 97.5 F (36.4 C) (Oral)   Resp 16   Ht 5\' 7"  (1.702 m)   Wt 78 kg   LMP 01/07/2018 (Approximate)   BMI 26.94 kg/m    Reviewed strip:   Baseline 140, moderate variability, + 15 x 15 accelerations, and lates + variables - max nadir 90 but mostly <110bpm. TOCO: doublets, 1-2 minutes, pit at 14  SVE: foley bulb still in place per RN  A/P: 27yo G1P0 @ 37+0 with IOL for FGR with category 2 tracing  1. accels and moderate variability confirm adequate O2 delivery to the brain.  Although the late decels indicate uteroplacental compromise, this is not unexpected given her asymmetric FGR.  For now, it is still conservative to continue with attempts to induce labor.  Should there be worsening of the strip, we can stop pitocin and give rests, we can go deliberately slower to attempt induction, or we could proceed with cesarean.  These have not yet been discussed with patient, as strip does not warrant intervention other than those already employed - O2, position changes, fluid boluses.   2. Hypotension has improved.  ----- Ranae Plumber, MD Attending Obstetrician and Gynecologist [facility], Department of OB/GYN [facility]

## 2018-09-07 NOTE — H&P (Signed)
OB History & Physical   History of Present Illness:  Chief Complaint: here for induction  HPI:  Erica PeersChelsea L Baldini is a 28 y.o. G1P0000 female at 7233w0d dated by US at 8243w3d.  She presents to L&D for IOL due to growth restriction.   Reports active FM; denies UCs, LOF or VB on arrival. Now feeling UCs mild in lower abdomen.  Pregnancy Issues: 1. Pos GBS urine 08/24/18 2. Asymmetric growth restriction 3. Anemia   Maternal Medical History:  History reviewed. No pertinent past medical history.  Past Surgical History:  Procedure Laterality Date  . NO PAST SURGERIES      No Known Allergies  Prior to Admission medications   Medication Sig Start Date End Date Taking? Authorizing Provider  ondansetron (ZOFRAN ODT) 4 MG disintegrating tablet Take 1 tablet (4 mg total) by mouth every 6 (six) hours as needed for nausea. 08/26/18  Yes McVey, Prudencio Pairebecca A, CNM  Prenatal Vit-Fe Fumarate-FA (PRENATAL MULTIVITAMIN) TABS tablet Take 1 tablet by mouth daily at 12 noon.   Yes [provider]     [facility]   Social History: She  reports that she has never smoked. She has never used smokeless tobacco. She reports previous alcohol use. She reports that she does not use drugs.  Family History: family history includes Anxiety disorder in her mother; Depression in her mother; Thyroid disease in her mother.   Review of Systems: A full review of systems was performed and negative except as noted in the HPI.     Physical Exam:  Vital Signs: BP 109/72 (BP Location: Left Arm)   Pulse 79   Temp 97.9 F (36.6 C) (Oral)   Resp 18   Ht 5\' 7"  (1.702 m)   Wt 78 kg   LMP 01/07/2018 (Approximate)   BMI 26.94 kg/m    General: no acute distress.  HEENT: normocephalic, atraumatic Heart: regular rate & rhythm.  No murmurs/rubs/gallops Lungs: clear to auscultation bilaterally, normal respiratory effort Abdomen: soft, gravid, non-tender;  EFW: 2004g on last growth US  08/24/18 Pelvic:   External: Normal external female genitalia  Cervix: Dilation: Closed / Effacement (%): Thick /      Extremities: non-tender, symmetric, no edema bilaterally.  DTRs: 2+  Neurologic: Alert & oriented x 3.    Results for orders placed or performed during the hospital encounter of 09/06/18 (from the past 24 hour(s))  CBC     Status: Abnormal   Collection Time: 09/07/18 12:40 AM  Result Value Ref Range   WBC 5.9 4.0 - 10.5 K/uL   RBC 3.25 (L) 3.87 - 5.11 MIL/uL   Hemoglobin 9.8 (L) 12.0 - 15.0 g/dL   HCT 16.129.4 (L) 09.636.0 - 04.546.0 %   MCV 90.5 80.0 - 100.0 fL   MCH 30.2 26.0 - 34.0 pg   MCHC 33.3 30.0 - 36.0 g/dL   RDW 40.912.8 81.111.5 - 91.415.5 %   Platelets 245 150 - 400 K/uL   nRBC 0.0 0.0 - 0.2 %  Type and screen     Status: None   Collection Time: 09/07/18 12:40 AM  Result Value Ref Range   ABO/RH(D) B POS    Antibody Screen NEG    Sample Expiration      09/10/2018 Performed at [facility], [address] [place], [place] [address]     Pertinent Results:  Prenatal Labs: Blood type/Rh  B Pos  Antibody screen neg  Rubella Immune  Varicella Immune  RPR  NR  HBsAg Neg  HIV NR  GC neg  Chlamydia neg  Genetic screening negative  1 hour GTT  74  GBS  Pos   FHT: 140bpm, mod  Variability, + accels, intermittent late decels.  TOCO: 1-32min, palpate moderate SVE:  Dilation: Closed / Effacement (%): Thick /      Cephalic by leopolds  US Ob Limited  Result Date: 08/14/2018 CLINICAL DATA:  Acute onset of light vaginal bleeding. EXAM: LIMITED OBSTETRIC ULTRASOUND FINDINGS: Number of Fetuses: 1 Heart Rate:  140 bpm Movement: Yes Presentation: Cephalic Placental Location: Right anterolateral Previa: No Amniotic Fluid (Subjective):  Decreased AFI: 6.3 cm BPD: 8.26 cm 33 w  2 d MATERNAL FINDINGS: Cervix:  The cervix remains closed, measuring 3.1 cm in length. Uterus/Adnexae: No abnormality visualized. IMPRESSION: 1. Single live intrauterine pregnancy, with a  biparietal diameter of 8.3 cm, corresponding to a gestational age of [redacted] weeks 2 days. This matches the gestational age of [redacted] weeks 3 days by LMP, reflecting an estimated date of delivery of September 28, 2018. 2. Decreased amniotic fluid again noted; AFI now measures 6.3 cm, compared to 6.7 cm on the prior study. This is less than 2.5 percentile. 3. Cervix remains closed, measuring 3.1 cm in length. No evidence of placenta previa. This exam is performed on an emergent basis and does not comprehensively evaluate fetal size, dating, or anatomy; follow-up complete OB US should be considered if further fetal assessment is warranted. These results were called by telephone at the time of interpretation on 08/14/2018 at 12:45 am to Sam RN at [facility], who verbally acknowledged these results. Electronically Signed   By: Roanna Raider M.D.   On: 08/14/2018 00:48   [place] Mfm Fetal Bpp W/nonstress  Result Date: 08/10/2018 ----------------------------------------------------------------------  OBSTETRICS REPORT                       (Signed Final 08/10/2018 02:19 pm) ---------------------------------------------------------------------- PATIENT INFO:  ID #:       [id number]                          D.O.B.:  Mar 09, 1991 (27 yrs)  Name:       Erica Huerta              Visit Date: 08/10/2018 01:46 pm ---------------------------------------------------------------------- PERFORMED BY:  Performed By:     Bridgette Habermann        Referred By:      Melonie [place] ---------------------------------------------------------------------- SERVICE(S) PROVIDED:   [place] MFM FETAL BPP W/NONSTRESS                         40981.1  ---------------------------------------------------------------------- INDICATIONS:   [redacted] weeks gestation of pregnancy                Z3A.33  ---------------------------------------------------------------------- FETAL EVALUATION:   Num Of Fetuses:         1  Fetal Heart Rate(bpm):  155  Cardiac Activity:       Present  Presentation:           Cephalic  Placenta:  Anterior  AFI Sum(cm)     %Tile       Largest Pocket(cm)  6.67            < 3         3.1  RUQ(cm)       RLQ(cm)       LUQ(cm)        LLQ(cm)  2.44          0             1.13           3.1 ---------------------------------------------------------------------- BIOPHYSICAL EVALUATION:  Amniotic F.V:   Within normal limits       F. Tone:        Observed  F. Movement:    Observed                   Score:          8/8  F. Breathing:   Observed ---------------------------------------------------------------------- GESTATIONAL AGE:  LMP:           30w 5d        Date:  01/07/18                 EDD:   10/14/18  Clinical EDD:  33w 0d                                        EDD:   09/28/18  Best:          33w 0d     Det. By:  Clinical EDD             EDD:   09/28/18 ---------------------------------------------------------------------- DOPPLER - FETAL VESSELS:  Umbilical Artery   S/D     %tile     RI    %tile                     PSV                                                   (cm/s)   3.5       90   0.71        90                     35.3 ---------------------------------------------------------------------- IMPRESSION:  ,  Thank you for referring your patient for antental testing due to  fetal growth restriction (efw 1460 (3%) on 08/03/18).  There is a singleton gestation at 33 weeks with normal  amniotic fluid volume.  Dating is by earliest available  ultrasound performed at [facility] on 04/02/18;  measurments were ocnsistent iwth 14 weeks 3 days.  The BPP was noted to be 8/8, which was reassuring.  The  umbilical artery doppler is 3.5 (normal).  The amniotic fluid  volume is 6.7cm  (2x2 pocket present).  Continue twice weeky antental testing and weekly dopplers  (she has appointment scheduled). Follow up growth every 3  weeks.  Thank you for allowing [place] to participate  in your patient's care.  Please do not hesitate to contact us if we can be of further  assistance. ----------------------------------------------------------------------  Consuelo Pandy, MD Electronically Signed Final Report   08/10/2018 02:19 pm ----------------------------------------------------------------------  [place] Mfm Ob Follow Up  Result Date: 08/31/2018 ----------------------------------------------------------------------  OBSTETRICS REPORT                    (Corrected Final 08/31/2018 09:45 am) ---------------------------------------------------------------------- PATIENT INFO:  ID #:       [id number]                          D.O.B.:  1991-03-19 (27 yrs)  Name:       Erica Huerta              Visit Date: 08/24/2018 02:52 pm ---------------------------------------------------------------------- PERFORMED BY:  Performed By:     Francene Boyers           Referred By:      Melonie [place] ---------------------------------------------------------------------- SERVICE(S) PROVIDED:   [place] MFM OB FOLLOW UP                                  40981.19  ---------------------------------------------------------------------- INDICATIONS:   [redacted] weeks gestation of pregnancy                Z3A.35  ---------------------------------------------------------------------- FETAL EVALUATION:  Num Of Fetuses:         1  Fetal Heart Rate(bpm):  147  Cardiac Activity:       Present  Presentation:           Cephalic  Placenta:               Anterior  AFI Sum(cm)     %Tile       Largest Pocket(cm)  8.11            7           3.25  RUQ(cm)       RLQ(cm)       LUQ(cm)        LLQ(cm)  3.25          0.97          1.25           2.64 ---------------------------------------------------------------------- BIOMETRY:  BPD:      86.4  mm     G. Age:  34w 6d         48  %    CI:        76.57   %    70 - 86                                                          FL/HC:       20.9   %    20.1 - 22.3  HC:      312.8  mm     G. Age:  35w 0d         19  %    HC/AC:  1.16        0.93 - 1.11  AC:      269.4  mm     G. Age:  31w 0d        < 3  %    FL/BPD:     75.7   %    71 - 87  FL:       65.4  mm     G. Age:  33w 5d         14  %    FL/AC:      24.3   %    20 - 24  HUM:        57  mm     G. Age:  33w 1d         28  %  Est. FW:    2004  gm      4 lb 7 oz      5  % ---------------------------------------------------------------------- GESTATIONAL AGE:  LMP:           32w 5d        Date:  01/07/18                 EDD:   10/14/18  Clinical EDD:  Consuello Closs35w 0d                                        EDD:   09/28/18  U/S Today:     33w 5d                                        EDD:   10/07/18  Best:          35w 0d     Det. By:  Clinical EDD             EDD:   09/28/18 ---------------------------------------------------------------------- ANATOMY:  Cavum:                 Visualized             Ductal Arch:            Visualized                         previously  Ventricles:            Normal appearance      Stomach:                Seen  Cerebellum:            Visualized             Abdominal Wall:         Visualized                         previously                                     previously  Posterior Fossa:       Visualized             Cord Vessels:           3 vessels,  previously                                     visualized previously  Face:                  Orbits visualized      Kidneys:                Normal appearance                         previously  Lips:                  Visualized             Bladder:                Seen                         previously  Heart:                 4-Chamber view         Spine:                  Visualized                         appears normal                                 previously  RVOT:                  Normal appearance      Upper Extremities:      Visualized                                                                         previously  LVOT:                  Normal appearance      Lower Extremities:      Visualized                                                                        previously ---------------------------------------------------------------------- DOPPLER - FETAL VESSELS:  Umbilical Artery   S/D     %tile     RI    %tile  3.09       80   0.67        83 ---------------------------------------------------------------------- CERVIX UTERUS ADNEXA:  Cervix  Length:           3.21  cm. ---------------------------------------------------------------------- IMPRESSION:  Thank you for referring your patient for a fetal growth  evaluation due to FGR .  There is a singleton gestation with low normal amniotic fluid  volume.AFI =  8 today though difficult to obtain a 2x2 pocket  Fetus is currently at 99 w 0d based on earliest u/s done at  [facility] clinic on 04/02/18 at 14w 3d with EDC of 09/28/2018  The estimated fetal weight is at the 5th     percentile. SGA. previously less than the 3rd percentile.  s/d Doppler of the mid cord is in normal range .  Recommend twice weekly evaluation with NST weekly AFI  and induction at 37-38  weeks ( fetal weight improved but with  low fluid I recommend proceeding with induction prior to 39-  40w).  Thank you for allowing [place] to participate in your patient's care.  Please do not hesitate to contact us if we can be of further  assistance.  patient complained of suprapubic pain - urine culture sent ----------------------------------------------------------------------                  Jimmey Ralph, MD Electronically Signed Corrected Final Report  08/31/2018 09:45 am ----------------------------------------------------------------------   Assessment:  Erica Huerta is a 28 y.o. G1P0000 female at [redacted]w[redacted]d with IOL for IUGR and Cat II tracing.   Plan:  1. Admit to Labor & Delivery; consents reviewed and obtained  2. Fetal Well being  - Fetal Tracing: Cat II tracing,  positive OCT after cytotec given at 0100, overall reassuring with moderate variability. Nursing able to trace UCs only with pt in semi-fowlers, but lates recur in that position, encouraged side lying for ideal uteroplacental perfusion.   - Group B Streptococcus ppx indicated: positive - Presentation: cephalic confirmed by leopolds/US done 1/6.   3. Routine OB: - Prenatal labs reviewed, as above - Rh B Pos - CBC, T&S, RPR on admit - Clear fluids, IVF  4. Induction of Labor due to IUGR and low AFI -  Contractions: external toco in place -  Pelvis unproven, adequate for TOL -  Induction with cytotec x 1 dose, will not repeat due to intermittent late decels. Will consider Pitocin, unable to place balloon catheter at this time.  -  Plan for continuous fetal monitoring  -  Maternal pain control as desired   Randa Ngo, CNM 09/07/18 6:29 AM

## 2018-09-08 ENCOUNTER — Encounter
Admission: EM | Disposition: A | Discharge status: Home / Self Care | Payer: Self-pay | Source: Home / Self Care | Attending: Obstetrics and Gynecology

## 2018-09-08 ENCOUNTER — Inpatient Hospital Stay: Payer: BLUE CROSS/BLUE SHIELD | Admitting: Anesthesiology

## 2018-09-08 LAB — RPR: RPR Ser Ql: NONREACTIVE

## 2018-09-08 SURGERY — Surgical Case
Anesthesia: Spinal

## 2018-09-08 MED ORDER — BUPIVACAINE IN DEXTROSE 0.75-8.25 % IT SOLN
INTRATHECAL | Status: DC | PRN
  Administered 2018-09-08: 12 mL via INTRATHECAL

## 2018-09-08 MED ORDER — BUPIVACAINE LIPOSOME 1.3 % IJ SUSP: 20.0000 mL | Freq: Once | INTRAMUSCULAR | Status: DC

## 2018-09-08 MED ORDER — FENTANYL CITRATE (PF) 100 MCG/2ML IJ SOLN
INTRAMUSCULAR | Status: AC
  Filled 2018-09-08: qty 2

## 2018-09-08 MED ORDER — LIDOCAINE HCL (PF) 1 % IJ SOLN
INTRAMUSCULAR | Status: DC | PRN
  Administered 2018-09-08 (×3): 1 mL via SUBCUTANEOUS

## 2018-09-08 MED ORDER — MEPERIDINE HCL 25 MG/ML IJ SOLN: 6.2500 mg | INTRAMUSCULAR | Status: DC | PRN

## 2018-09-08 MED ORDER — ACETAMINOPHEN 500 MG PO TABS
1000.0000 mg | ORAL_TABLET | Freq: Four times a day (QID) | ORAL | Status: AC
  Administered 2018-09-09 (×4): 1000 mg via ORAL
  Filled 2018-09-08 (×4): qty 2

## 2018-09-08 MED ORDER — SODIUM CHLORIDE (PF) 0.9 % IJ SOLN
INTRAMUSCULAR | Status: AC
  Filled 2018-09-08: qty 50

## 2018-09-08 MED ORDER — PHENYLEPHRINE HCL 10 MG/ML IJ SOLN
INTRAMUSCULAR | Status: DC | PRN
  Administered 2018-09-08 (×2): 100 ug via INTRAVENOUS

## 2018-09-08 MED ORDER — SUCCINYLCHOLINE CHLORIDE 20 MG/ML IJ SOLN
INTRAMUSCULAR | Status: AC
  Filled 2018-09-08: qty 1

## 2018-09-08 MED ORDER — OXYTOCIN 40 UNITS IN NORMAL SALINE INFUSION - SIMPLE MED
2.5000 [IU]/h | INTRAVENOUS | Status: AC
  Administered 2018-09-08: 2.5 [IU]/h via INTRAVENOUS

## 2018-09-08 MED ORDER — KETOROLAC TROMETHAMINE 30 MG/ML IJ SOLN: 15.0000 mg | Freq: Four times a day (QID) | INTRAMUSCULAR | Status: DC

## 2018-09-08 MED ORDER — OXYTOCIN 10 UNIT/ML IJ SOLN
INTRAVENOUS | Status: DC | PRN
  Administered 2018-09-08: 40 [IU] via INTRAVENOUS

## 2018-09-08 MED ORDER — FENTANYL CITRATE (PF) 100 MCG/2ML IJ SOLN
INTRAMUSCULAR | Status: DC | PRN
  Administered 2018-09-08: 15 ug via INTRATHECAL

## 2018-09-08 MED ORDER — CEFAZOLIN SODIUM-DEXTROSE 2-4 GM/100ML-% IV SOLN
2.0000 g | INTRAVENOUS | Status: AC
  Administered 2018-09-08: 2 g via INTRAVENOUS
  Filled 2018-09-08: qty 100

## 2018-09-08 MED ORDER — SODIUM CHLORIDE 0.9 % IV SOLN
INTRAVENOUS | Status: DC | PRN
  Administered 2018-09-08: 45 ug/min via INTRAVENOUS

## 2018-09-08 MED ORDER — BUPIVACAINE HCL (PF) 0.25 % IJ SOLN
INTRAMUSCULAR | Status: DC | PRN
  Administered 2018-09-08: 30 mL

## 2018-09-08 MED ORDER — STERILE WATER FOR INJECTION IJ SOLN
INTRAMUSCULAR | Status: AC
  Filled 2018-09-08: qty 50

## 2018-09-08 MED ORDER — BUPIVACAINE LIPOSOME 1.3 % IJ SUSP
INTRAMUSCULAR | Status: AC
  Filled 2018-09-08: qty 20

## 2018-09-08 MED ORDER — MORPHINE SULFATE (PF) 0.5 MG/ML IJ SOLN
INTRAMUSCULAR | Status: DC | PRN
  Administered 2018-09-08: .1 mg via INTRATHECAL

## 2018-09-08 MED ORDER — KETOROLAC TROMETHAMINE 30 MG/ML IJ SOLN
30.0000 mg | Freq: Four times a day (QID) | INTRAMUSCULAR | Status: DC
  Administered 2018-09-08 – 2018-09-09 (×3): 30 mg via INTRAVENOUS
  Filled 2018-09-08 (×4): qty 1

## 2018-09-08 MED ORDER — SODIUM CHLORIDE 0.9 % IV SOLN
INTRAVENOUS | Status: DC | PRN
  Administered 2018-09-08: 70 mL

## 2018-09-08 MED ORDER — PROPOFOL 10 MG/ML IV BOLUS
INTRAVENOUS | Status: AC
  Filled 2018-09-08: qty 20

## 2018-09-08 MED ORDER — SODIUM CHLORIDE 0.9 % IV SOLN
500.0000 mg | Freq: Once | INTRAVENOUS | Status: AC
  Administered 2018-09-08 (×2): 500 mg via INTRAVENOUS
  Filled 2018-09-08 (×2): qty 500

## 2018-09-08 MED ORDER — ONDANSETRON HCL 4 MG/2ML IJ SOLN: 4.0000 mg | Freq: Three times a day (TID) | INTRAMUSCULAR | Status: DC | PRN

## 2018-09-08 MED ORDER — IBUPROFEN 800 MG PO TABS: 800.0000 mg | ORAL_TABLET | Freq: Four times a day (QID) | ORAL | Status: DC

## 2018-09-08 MED ORDER — BUPIVACAINE HCL (PF) 0.5 % IJ SOLN
INTRAMUSCULAR | Status: AC
  Filled 2018-09-08: qty 30

## 2018-09-08 MED ORDER — MORPHINE SULFATE (PF) 0.5 MG/ML IJ SOLN
INTRAMUSCULAR | Status: AC
  Filled 2018-09-08: qty 10

## 2018-09-08 MED ORDER — GABAPENTIN 400 MG PO CAPS
400.0000 mg | ORAL_CAPSULE | Freq: Every day | ORAL | Status: DC
  Administered 2018-09-09 – 2018-09-11 (×2): 400 mg via ORAL
  Filled 2018-09-08: qty 4
  Filled 2018-09-08 (×2): qty 1
  Filled 2018-09-08: qty 4
  Filled 2018-09-08 (×2): qty 1

## 2018-09-08 SURGICAL SUPPLY — 25 items
BARRIER ADHS 3X4 INTERCEED (GAUZE/BANDAGES/DRESSINGS) ×2 IMPLANT
CANISTER SUCT 3000ML PPV (MISCELLANEOUS) ×2 IMPLANT
CHLORAPREP W/TINT 26ML (MISCELLANEOUS) ×2 IMPLANT
COVER WAND RF STERILE (DRAPES) IMPLANT
DRSG OPSITE POSTOP 4X10 (GAUZE/BANDAGES/DRESSINGS) ×2 IMPLANT
DRSG TELFA 3X8 NADH (GAUZE/BANDAGES/DRESSINGS) ×2 IMPLANT
ELECT REM PT RETURN 9FT ADLT (ELECTROSURGICAL) ×2
ELECTRODE REM PT RTRN 9FT ADLT (ELECTROSURGICAL) ×1 IMPLANT
GAUZE SPONGE 4X4 12PLY STRL (GAUZE/BANDAGES/DRESSINGS) ×2 IMPLANT
GOWN STRL REUS W/ TWL LRG LVL3 (GOWN DISPOSABLE) ×3 IMPLANT
GOWN STRL REUS W/TWL LRG LVL3 (GOWN DISPOSABLE) ×3
NEEDLE HYPO 22GX1.5 SAFETY (NEEDLE) ×2 IMPLANT
NS IRRIG 1000ML POUR BTL (IV SOLUTION) ×2 IMPLANT
PAD OB MATERNITY 4.3X12.25 (PERSONAL CARE ITEMS) ×2 IMPLANT
PAD PREP 24X41 OB/GYN DISP (PERSONAL CARE ITEMS) ×2 IMPLANT
STRIP CLOSURE SKIN 1/2X4 (GAUZE/BANDAGES/DRESSINGS) ×2 IMPLANT
SUT MNCRL 4-0 (SUTURE) ×1
SUT MNCRL 4-0 27XMFL (SUTURE) ×1
SUT VIC AB 0 CT1 36 (SUTURE) ×4 IMPLANT
SUT VIC AB 0 CTX 36 (SUTURE) ×2
SUT VIC AB 0 CTX36XBRD ANBCTRL (SUTURE) ×2 IMPLANT
SUT VIC AB 2-0 SH 27 (SUTURE) ×2
SUT VIC AB 2-0 SH 27XBRD (SUTURE) ×2 IMPLANT
SUTURE MNCRL 4-0 27XMF (SUTURE) ×1 IMPLANT
SYR 30ML LL (SYRINGE) ×4 IMPLANT

## 2018-09-08 NOTE — Discharge Summary (Signed)
Obstetrical Discharge Summary  Patient Name: Erica Huerta DOB: 04-10-1991 MRN: [id number]  Date of Admission: 09/06/2018 Date of Discharge: 09/11/2018  Primary OB: [facility]   Gestational Age at Delivery: [redacted]w[redacted]d   Antepartum complications:  1. Pos GBS urine 08/24/18 2. Asymmetric growth restriction 3. Anemia Admitting Diagnosis: IOL Secondary Diagnosis: Patient Active Problem List   Diagnosis Date Noted  . Intrauterine growth restriction affecting care of mother, antepartum, third trimester, not applicable or unspecified fetus 09/07/2018  . Preterm uterine contractions, antepartum, third trimester 08/26/2018  . Nausea/vomiting in pregnancy 06/23/2018  . Encounter for annual routine gynecological examination 07/04/2017    Augmentation: AROM, Pitocin, Cytotec and Foley Balloon Complications: None Intrapartum complications/course:  Failed induction at 4cm, 24hr ruptured. PLTCS, uncomplicated. Baby to [facility] for oxygenation.  Date of Delivery: 09/08/18 Delivered By: Christeen Douglas Delivery Type: primary cesarean section, low transverse incision Anesthesia: spinal Placenta: Spontaneous Laceration: n/a Episiotomy: none Newborn Data: Live born female "Kamren" Birth Weight: 5 lb 15.9 oz (2720 g) APGAR: 8, 8  Newborn Delivery   Birth date/time:  09/08/2018 21:33:00 Delivery type:  C-Section, Low Transverse C-section categorization:  Primary       Discharge Physical Exam:  BP 117/79   Pulse 79   Temp 98.3 F (36.8 C) (Oral)   Resp 20   Ht 5\' 7"  (1.702 m)   Wt 78 kg   LMP 01/07/2018 (Approximate)   SpO2 100%   BMI 26.94 kg/m   General: NAD CV: RRR Pulm: CTABL, nl effort ABD: s/nd/nt, fundus firm and below the umbilicus Lochia: moderate Incision: c/d/i  DVT Evaluation: LE non-ttp, no evidence of DVT on exam.  Hemoglobin  Date Value Ref Range Status  09/09/2018 8.9 (L) 12.0 - 15.0 g/dL Final   HCT  Date Value Ref Range Status  09/09/2018 28.1 (L)  36.0 - 46.0 % Final    Post partum course: routine, by time of discharge patient was ambulating, voiding, tolerating regular diet and tolerating her pain with PO meds Postpartum Procedures: none Disposition: stable, discharge to home. Baby Feeding: breastmilk  Baby Disposition: home with mom  Rh Immune globulin given:n/a Rubella vaccine given: n/a Tdap vaccine given in AP or PP setting: AP Flu vaccine given in AP or PP setting: AP  Contraception: undecided  Prenatal Labs: Blood type/Rh --/--/B POS (01/20 0040)  Antibody screen neg  Rubella Immune  Varicella Immune  RPR NR  HBsAg Neg  HIV NR  GC neg  Chlamydia neg  Genetic screening negative  1 hour GTT 74  3 hour GTT n/a  GBS positive     Plan:  [name] was discharged to home in good condition. Follow-up appointment at [facility] with delivering provider in 2 weeks   Discharge Medications: Allergies as of 09/11/2018   No Known Allergies     Medication List    STOP taking these medications   ondansetron 4 MG disintegrating tablet Commonly known as:  ZOFRAN ODT     TAKE these medications   ibuprofen 800 MG tablet Commonly known as:  ADVIL,MOTRIN Take 1 tablet (800 mg total) by mouth every 6 (six) hours.   oxyCODONE 5 MG immediate release tablet Commonly known as:  Oxy IR/ROXICODONE Take 1 tablet (5 mg total) by mouth every 4 (four) hours as needed for moderate pain.   prenatal multivitamin Tabs tablet Take 1 tablet by mouth daily at 12 noon.            Discharge Care Instructions  (  From admission, onward)         Start     Ordered   09/11/18 0000  Discharge wound care:    Comments:  Keep incision dry, clean.   09/11/18 1306          Follow-up Information    Christeen DouglasBeasley, Bethany, MD In 2 weeks.   Specialty:  Obstetrics and Gynecology Why:  For postop check Contact information: [address] [place] [address] [phone]            Signed: ----- Ranae Plumberhelsea Ward, MD Attending Obstetrician and Gynecologist [facility], Department of OB/GYN [facility]

## 2018-09-08 NOTE — Anesthesia Preprocedure Evaluation (Addendum)
Anesthesia Evaluation  Patient identified by MRN, date of birth, ID band Patient awake    Reviewed: Allergy & Precautions, H&P , NPO status , Patient's Chart, lab work & pertinent test results  Airway Mallampati: II  TM Distance: >3 FB Neck ROM: full    Dental  (+) Teeth Intact   Pulmonary neg pulmonary ROS,           Cardiovascular Exercise Tolerance: Good negative cardio ROS       Neuro/Psych    GI/Hepatic negative GI ROS,   Endo/Other    Renal/GU   negative genitourinary   Musculoskeletal   Abdominal   Peds  Hematology negative hematology ROS (+)   Anesthesia Other Findings History reviewed. No pertinent past medical history.  Past Surgical History: No date: NO PAST SURGERIES  BMI    Body Mass Index:  26.94 kg/m      Reproductive/Obstetrics (+) Pregnancy                             Anesthesia Physical Anesthesia Plan  ASA: II and emergent  Anesthesia Plan: Spinal   Post-op Pain Management:    Induction:   PONV Risk Score and Plan:   Airway Management Planned:   Additional Equipment:   Intra-op Plan:   Post-operative Plan:   Informed Consent: I have reviewed the patients History and Physical, chart, labs and discussed the procedure including the risks, benefits and alternatives for the proposed anesthesia with the patient or authorized representative who has indicated his/her understanding and acceptance.     Dental Advisory Given  Plan Discussed with: Anesthesiologist  Anesthesia Plan Comments:        Anesthesia Quick Evaluation

## 2018-09-08 NOTE — Anesthesia Post-op Follow-up Note (Signed)
Anesthesia QCDR form completed.        

## 2018-09-08 NOTE — Progress Notes (Signed)
Erica Huerta is a 28 y.o. G1P0000 at [redacted]w[redacted]d IOL for growth restriction  Subjective: Stadol for pain control  Objective: BP 116/70   Pulse 84   Temp 98.2 F (36.8 C) (Oral)   Resp 18   Ht 5\' 7"  (1.702 m)   Wt 78 kg   LMP 01/07/2018 (Approximate)   BMI 26.94 kg/m  I/O last 3 completed shifts: In: 5112 [I.V.:4747.8; IV Piggyback:364.2] Out: -  No intake/output data recorded.  FHT:  FHR: 145 bpm, variability: minimal ,  accelerations:  Present,  decelerations:  Present occasional late decels UC:   regular, every 3 minutes SVE:   Dilation: 4 Effacement (%): 70 Station: -2, -1 Exam by:: Dalbert Garnet MD  Labs: Lab Results  Component Value Date   WBC 5.9 09/07/2018   HGB 9.8 (L) 09/07/2018   HCT 29.4 (L) 09/07/2018   MCV 90.5 09/07/2018   PLT 245 09/07/2018    Assessment / Plan: Discussed with patient and family unchanged cervical exam and the absence of labor. She is tolerating well. Cat II strip present throughout the day. Fluids clear, no evidence of infection.  We discussed again the advisability of c/s and I am comfortable waiting another 2 hours. She is ruptured for 18 hrs now.  No change in 2 hrs would recommend proceeding to c/s. She declines epidural at this time.  Christeen Douglas 09/08/2018, 5:37 PM

## 2018-09-08 NOTE — Op Note (Signed)
  Cesarean Section Procedure Note  Date of procedure: 09/08/2018   Pre-operative Diagnosis: Intrauterine pregnancy at [redacted]w[redacted]d;  - failure to progress - Cat II strip remote from delivery - IOL for intrauterine growth restriction  Post-operative Diagnosis: same, delivered.  Procedure: Primary Low Transverse Cesarean Section through Pfannenstiel incision  Surgeon: Christeen Douglas, MD  Assistant(s):  CST  Anesthesia: Spinal anesthesia  Anesthesiologist: Jovita Gamma, MD Anesthesiologist: Jovita Gamma, MD CRNA: Waldo Laine, CRNA  Estimated Blood Loss:  300 mL, QBL 273cc         Drains: none         Total IV Fluids: see anesthesia report  Urine Output: see anesthesia notes         Specimens: cord gases, placenta         Complications:  None; patient tolerated the procedure well.         Disposition: PACU - hemodynamically stable.         Condition: stable  Findings:  A female infant "Kamren" in cephalic presentation. Amniotic fluid - Clear  Birth weight 2720 g.  Apgars of 8 and 8 at one and five minutes respectively.  Intact placenta with a three-vessel cord.  Grossly normal uterus, tubes and ovaries bilaterally. No intraabdominal adhesions were noted.  Indications: failed induction, failure to progress: arrest of dilation and non-reassuring fetal status  Procedure Details  The patient was taken to Operating Room, identified as the correct patient and the procedure verified as C-Section Delivery. A formal Time Out was held with all team members present and in agreement.  After induction of anesthesia, the patient was draped and prepped in the usual sterile manner. A Pfannenstiel skin incision was made and carried down through the subcutaneous tissue to the fascia. Fascial incision was made and extended transversely with the Mayo scissors. The fascia was separated from the underlying rectus tissue superiorly and inferiorly. The peritoneum was identified and  entered bluntly. Peritoneal incision was extended longitudinally. The utero-vesical peritoneal reflection was incised transversely and a bladder flap was created digitally.   A low transverse hysterotomy was made. The fetus was delivered atraumatically. The umbilical cord was clamped x2 and cut and the infant was handed to the awaiting pediatricians. The placenta was removed intact and appeared normal, intact, and with a 3-vessel cord.   The uterus was exteriorized and cleared of all clot and debris. The hysterotomy was closed with running sutures of 0-Vicryl. A second imbricating layer was placed with the same suture. Excellent hemostasis was observed. The peritoneal cavity was cleared of all clots and debris. The uterus was returned to the abdomen.   The pelvis was irrigated and again, excellent hemostasis was noted. The fascia was then reapproximated with running sutures of 0 Vicryl.  The subcutaneous tissue was reapproximated with running sutures of 0 Vicry. The skin was reapproximated with a 4-0 Monocryl subcuticular stitch.  39ml (in 30 of 0.5% bupivicaine and 66ml of NSS) of liposomal bupivicaine placed in the fascial and skin lines.  Instrument, sponge, and needle counts were correct prior to the abdominal closure and at the conclusion of the case.   The patient tolerated the procedure well and was transferred to the recovery room in stable condition.   Christeen Douglas, MD 09/08/2018

## 2018-09-08 NOTE — Transfer of Care (Signed)
Immediate Anesthesia Transfer of Care Note  Patient: Erica Huerta  Procedure(s) Performed: CESAREAN SECTION (N/A )  Patient Location: PACU  Anesthesia Type:Spinal  Level of Consciousness: awake, alert , oriented and patient cooperative  Airway & Oxygen Therapy: Patient Spontanous Breathing  Post-op Assessment: Report given to RN and Post -op Vital signs reviewed and stable  Post vital signs: Reviewed and stable  Last Vitals:  Vitals Value Taken Time  BP    Temp 36.7 C 09/08/2018 10:30 PM  Pulse 84 09/08/2018 10:35 PM  Resp 15 09/08/2018 10:35 PM  SpO2 100 % 09/08/2018 10:35 PM  Vitals shown include unvalidated device data.  Last Pain:  Vitals:   09/08/18 2230  TempSrc: Oral  PainSc:       Patients Stated Pain Goal: 2 (09/08/18 1148)  Complications: No apparent anesthesia complications

## 2018-09-08 NOTE — Progress Notes (Signed)
Erica Huerta 27yo G1P0000 [redacted]w[redacted]d with IOL for intrauterine growth restriction now x24hrs. Pt was AROM at 11:30pm last night and has been cat II throughout, with late decels as expected because of iol indication. However, she has not made cervical change despite adequate contractions since her cervical balloon was expelled.  Late decels continue and now with minimal variability we have discontinued the pitocin.  The risks of cesarean section discussed with the patient included but were not limited to: bleeding which may require transfusion or reoperation; infection which may require antibiotics; injury to bowel, bladder, ureters or other surrounding organs; injury to the fetus; need for additional procedures including hysterectomy in the event of a life-threatening hemorrhage; placental abnormalities wth subsequent pregnancies, incisional problems, thromboembolic phenomenon and other postoperative/anesthesia complications. The patient concurred with the proposed plan, giving informed written consent for the procedure.   Patient has been NPO since now she will remain NPO for procedure. Anesthesia and OR aware. Preoperative prophylactic antibiotics and SCDs ordered on call to the OR.  To OR when ready.

## 2018-09-09 ENCOUNTER — Encounter: Payer: Self-pay | Admitting: Obstetrics and Gynecology

## 2018-09-09 LAB — CBC
HEMATOCRIT: 28.1 % — AB (ref 36.0–46.0)
Hemoglobin: 8.9 g/dL — ABNORMAL LOW (ref 12.0–15.0)
MCH: 29.6 pg (ref 26.0–34.0)
MCHC: 31.7 g/dL (ref 30.0–36.0)
MCV: 93.4 fL (ref 80.0–100.0)
Platelets: 206 10*3/uL (ref 150–400)
RBC: 3.01 MIL/uL — ABNORMAL LOW (ref 3.87–5.11)
RDW: 12.8 % (ref 11.5–15.5)
WBC: 10.9 10*3/uL — ABNORMAL HIGH (ref 4.0–10.5)
nRBC: 0 % (ref 0.0–0.2)

## 2018-09-09 MED ORDER — MEASLES, MUMPS & RUBELLA VAC IJ SOLR: 0.5000 mL | Freq: Once | INTRAMUSCULAR | Status: DC

## 2018-09-09 MED ORDER — PRENATAL MULTIVITAMIN CH
1.0000 | ORAL_TABLET | Freq: Every day | ORAL | Status: DC
  Administered 2018-09-09 – 2018-09-11 (×3): 1 via ORAL
  Filled 2018-09-09 (×3): qty 1

## 2018-09-09 MED ORDER — SIMETHICONE 80 MG PO CHEW
80.0000 mg | CHEWABLE_TABLET | Freq: Three times a day (TID) | ORAL | Status: DC
  Administered 2018-09-09 – 2018-09-11 (×7): 80 mg via ORAL
  Filled 2018-09-09 (×6): qty 1

## 2018-09-09 MED ORDER — DIPHENHYDRAMINE HCL 25 MG PO CAPS: 25.0000 mg | ORAL_CAPSULE | Freq: Four times a day (QID) | ORAL | Status: DC | PRN

## 2018-09-09 MED ORDER — COCONUT OIL OIL: 1.0000 "application " | TOPICAL_OIL | Status: DC | PRN

## 2018-09-09 MED ORDER — IBUPROFEN 800 MG PO TABS
800.0000 mg | ORAL_TABLET | Freq: Four times a day (QID) | ORAL | Status: DC
  Administered 2018-09-09 – 2018-09-11 (×8): 800 mg via ORAL
  Filled 2018-09-09 (×8): qty 1

## 2018-09-09 MED ORDER — SENNOSIDES-DOCUSATE SODIUM 8.6-50 MG PO TABS
2.0000 | ORAL_TABLET | ORAL | Status: DC
  Administered 2018-09-09 – 2018-09-11 (×3): 2 via ORAL
  Filled 2018-09-09 (×3): qty 2

## 2018-09-09 MED ORDER — SIMETHICONE 80 MG PO CHEW
80.0000 mg | CHEWABLE_TABLET | ORAL | Status: DC
  Filled 2018-09-09: qty 1

## 2018-09-09 MED ORDER — TETANUS-DIPHTH-ACELL PERTUSSIS 5-2.5-18.5 LF-MCG/0.5 IM SUSP: 0.5000 mL | Freq: Once | INTRAMUSCULAR | Status: DC

## 2018-09-09 MED ORDER — SIMETHICONE 80 MG PO CHEW
80.0000 mg | CHEWABLE_TABLET | ORAL | Status: DC | PRN
  Administered 2018-09-11: 80 mg via ORAL
  Filled 2018-09-09: qty 1

## 2018-09-09 MED ORDER — MENTHOL 3 MG MT LOZG
1.0000 | LOZENGE | OROMUCOSAL | Status: DC | PRN
  Filled 2018-09-09: qty 9

## 2018-09-09 MED ORDER — FLEET ENEMA 7-19 GM/118ML RE ENEM: 1.0000 | ENEMA | Freq: Every day | RECTAL | Status: DC | PRN

## 2018-09-09 MED ORDER — DIBUCAINE 1 % RE OINT: 1.0000 "application " | TOPICAL_OINTMENT | RECTAL | Status: DC | PRN

## 2018-09-09 MED ORDER — LACTATED RINGERS IV SOLN: INTRAVENOUS | Status: DC

## 2018-09-09 MED ORDER — WITCH HAZEL-GLYCERIN EX PADS: 1.0000 "application " | MEDICATED_PAD | CUTANEOUS | Status: DC | PRN

## 2018-09-09 MED ORDER — OXYCODONE HCL 5 MG PO TABS
5.0000 mg | ORAL_TABLET | ORAL | Status: DC | PRN
  Administered 2018-09-09: 5 mg via ORAL
  Administered 2018-09-09: 10 mg via ORAL
  Administered 2018-09-10: 5 mg via ORAL
  Administered 2018-09-10: 10 mg via ORAL
  Administered 2018-09-10 (×2): 5 mg via ORAL
  Administered 2018-09-11: 10 mg via ORAL
  Filled 2018-09-09: qty 1
  Filled 2018-09-09 (×2): qty 2
  Filled 2018-09-09: qty 1
  Filled 2018-09-09: qty 2
  Filled 2018-09-09 (×2): qty 1

## 2018-09-09 MED ORDER — BISACODYL 10 MG RE SUPP: 10.0000 mg | Freq: Every day | RECTAL | Status: DC | PRN

## 2018-09-09 NOTE — Anesthesia Post-op Follow-up Note (Signed)
  Anesthesia Pain Follow-up Note  Patient: Erica Huerta  Day #: 1  Date of Follow-up: 09/09/2018 Time: 9:13 AM  Last Vitals:  Vitals:   09/09/18 0715 09/09/18 0800  BP: 94/61   Pulse: 81 88  Resp: 20   Temp: 36.9 C   SpO2: 99% 99%    Level of Consciousness: alert  Pain: none   Side Effects:None  Catheter Site Exam:clean, dry, no drainage     Plan: D/C from anesthesia care at surgeon's request  Karoline Caldwell

## 2018-09-09 NOTE — Anesthesia Postprocedure Evaluation (Signed)
Anesthesia Post Note  Patient: Erica Huerta  Procedure(s) Performed: CESAREAN SECTION (N/A )  Patient location during evaluation: Mother Baby Anesthesia Type: Spinal Level of consciousness: oriented and awake and alert Pain management: pain level controlled Vital Signs Assessment: post-procedure vital signs reviewed and stable Respiratory status: spontaneous breathing and respiratory function stable Cardiovascular status: blood pressure returned to baseline and stable Postop Assessment: no headache, no backache, no apparent nausea or vomiting and able to ambulate Anesthetic complications: no     Last Vitals:  Vitals:   09/09/18 0715 09/09/18 0800  BP: 94/61   Pulse: 81 88  Resp: 20   Temp: 36.9 C   SpO2: 99% 99%    Last Pain:  Vitals:   09/09/18 0837  TempSrc:   PainSc: 6                  Armando Lauman Lawerance Cruel

## 2018-09-09 NOTE — Lactation Note (Signed)
This note was copied from a baby's chart. Lactation Consultation Note  Patient Name: Erica Huerta Today's Date: 09/09/2018     Maternal Data    Feeding    LATCH Score                   Interventions    Lactation Tools Discussed/Used     Consult Status  Parents of baby and family members have been educated on the benefits of breastmilk and breastfeeding. They have been encouraged to help MOB pump and understand that building a milk supply takes awhile, but works better with consistency. Parents have all pumping equipment necessary for now, but will need to have [facility] tomorrow f/u on getting them access to a breastpump if they do not have one already.     [name] 09/09/2018, 11:01 PM

## 2018-09-09 NOTE — Progress Notes (Signed)
Subjective: Postpartum Day 1: Cesarean Delivery Patient reports no problems voiding.   Baby in nursery Objective: Vital signs in last 24 hours: Temp:  [98 F (36.7 C)-99.2 F (37.3 C)] 98.4 F (36.9 C) (01/22 0715) Pulse Rate:  [71-97] 88 (01/22 0800) Resp:  [11-22] 20 (01/22 0715) BP: (92-145)/(46-110) 94/61 (01/22 0715) SpO2:  [97 %-100 %] 99 % (01/22 0800)  Physical Exam:  General: alert and cooperative Lochia: appropriate Uterine Fundus: firm Incision: no significant drainage DVT Evaluation: No evidence of DVT seen on physical exam.  Recent Labs    09/07/18 0040 09/09/18 0510  HGB 9.8* 8.9*  HCT 29.4* 28.1*    Assessment/Plan: Status post Cesarean section. Doing well postoperatively.  Continue current care. D/c IV  Erica Huerta 09/09/2018, 9:11 AM

## 2018-09-10 LAB — SURGICAL PATHOLOGY

## 2018-09-10 MED ORDER — FERROUS SULFATE 325 (65 FE) MG PO TABS
325.0000 mg | ORAL_TABLET | Freq: Two times a day (BID) | ORAL | Status: DC
  Administered 2018-09-10 – 2018-09-11 (×2): 325 mg via ORAL
  Filled 2018-09-10 (×2): qty 1

## 2018-09-10 NOTE — Lactation Note (Signed)
This note was copied from a baby's chart. Lactation Consultation Note  Patient Name: Erica Huerta Date: 09/10/2018 Reason for consult: Follow-up assessment   Maternal Data   Pumping reviewed, mother is brining colostrum to her baby.   LATCH Score                   Interventions    Lactation Tools Discussed/Used     Consult Status      Trudee Grip 09/10/2018, 5:16 PM

## 2018-09-10 NOTE — Progress Notes (Signed)
Post Op Day 2  Subjective: Doing well, no concerns. Ambulating without difficulty, pain managed with PO meds, tolerating regular diet, and voiding without difficulty.   No fever/chills, chest pain, shortness of breath, nausea/vomiting, or leg pain. No nipple or breast pain.   Objective: BP 106/60 (BP Location: Right Arm)   Pulse 75   Temp 98 F (36.7 C) (Oral)   Resp 20   Ht 5\' 7"  (1.702 m)   Wt 78 kg   LMP 01/07/2018 (Approximate)   SpO2 98%   BMI 26.94 kg/m    Physical Exam:  General: alert, cooperative, appears stated age and no distress Breasts: soft/nontender CV: RRR Pulm: nl effort, CTABL Abdomen: soft, non-tender, active bowel sounds Uterine Fundus: firm Incision: healing well, no significant drainage, no dehiscence, no significant erythema Lochia: appropriate DVT Evaluation: No evidence of DVT seen on physical exam. No cords or calf tenderness. No significant calf/ankle edema.  Recent Labs    09/09/18 0510  HGB 8.9*  HCT 28.1*  WBC 10.9*  PLT 206    Assessment/Plan: 27 y.o. G1P0000 postop day # 2  -Continue routine postpartum care -Lactation consult PRN for breastfeeding/pumping -Acute blood loss anemia - hemodynamically stable and asymptomatic; start PO ferrous sulfate BID with stool softeners  -Immunization status:discharge/ all immunizations up to date  Disposition: Continue inpatient postpartum care   LOS: 3 days   Genia Del, CNM 09/10/2018, 12:50 PM   ----- Genia Del Certified Nurse Midwife [facility]

## 2018-09-11 MED ORDER — OXYCODONE HCL 5 MG PO TABS: 5.0000 mg | ORAL_TABLET | ORAL | 0 refills | Status: DC | PRN

## 2018-09-11 MED ORDER — IBUPROFEN 800 MG PO TABS: 800.0000 mg | ORAL_TABLET | Freq: Four times a day (QID) | ORAL | 1 refills | Status: DC

## 2018-09-11 NOTE — Discharge Instructions (Signed)
Discharge instructions:   Call office if you have any of the following: headache, visual changes, fever >101.0 F, chills, shortness of breath, breast concerns, excessive vaginal bleeding, incision drainage or problems, leg pain or redness, depression or any other concerns.   Activity: Do not lift > 10 lbs for 6 weeks.  No intercourse or tampons for 6 weeks.  No driving for 1-2 weeks.   Call your doctor for increased pain or vaginal bleeding, temperature above 101.0, depression, or concerns.  No strenuous activity or heavy lifting for 6 weeks.  No intercourse, tampons, douching, or enemas for 6 weeks.  No tub baths-showers only.  No driving for 2 weeks or while taking pain medications.  Continue prenatal vitamin and iron.  Increase calories and fluids while breastfeeding.  You may have a slight fever when your milk comes in, but it should go away on its own.  If it does not, and rises above 101.0 please call the doctor.  For concerns about your baby, please call your pediatrician For breastfeeding concerns, the lactation consultant can be reached at [phone]    Cesarean Delivery, Care After This sheet gives you information about how to care for yourself after your procedure. Your health care provider may also give you more specific instructions. If you have problems or questions, contact your health care provider. What can I expect after the procedure? After the procedure, it is common to have:  A small amount of blood or clear fluid coming from the incision.  Some redness, swelling, and pain in your incision area.  Some abdominal pain and soreness.  Vaginal bleeding (lochia). Even though you did not have a vaginal delivery, you will still have vaginal bleeding and discharge.  Pelvic cramps.  Fatigue. You may have pain, swelling, and discomfort in the tissue between your vagina and your anus (perineum) if:  Your C-section was unplanned, and you were allowed to labor and  push.  An incision was made in the area (episiotomy) or the tissue tore during attempted vaginal delivery. Follow these instructions at home: Incision care   Follow instructions from your health care provider about how to take care of your incision. Make sure you: ? Wash your hands with soap and water before you change your bandage (dressing). If soap and water are not available, use hand sanitizer. ? If you have a dressing, change it or remove it as told by your health care provider. ? Leave stitches (sutures), skin staples, skin glue, or adhesive strips in place. These skin closures may need to stay in place for 2 weeks or longer. If adhesive strip edges start to loosen and curl up, you may trim the loose edges. Do not remove adhesive strips completely unless your health care provider tells you to do that.  Check your incision area every day for signs of infection. Check for: ? More redness, swelling, or pain. ? More fluid or blood. ? Warmth. ? Pus or a bad smell.  Do not take baths, swim, or use a hot tub until your health care provider says it's okay. Ask your health care provider if you can take showers.  When you cough or sneeze, hug a pillow. This helps with pain and decreases the chance of your incision opening up (dehiscing). Do this until your incision heals. Medicines  Take over-the-counter and prescription medicines only as told by your health care provider.  If you were prescribed an antibiotic medicine, take it as told by your health care provider. Do  not stop taking the antibiotic even if you start to feel better.  Do not drive or use heavy machinery while taking prescription pain medicine. Lifestyle  Do not drink alcohol. This is especially important if you are breastfeeding or taking pain medicine.  Do not use any products that contain nicotine or tobacco, such as cigarettes, e-cigarettes, and chewing tobacco. If you need help quitting, ask your health care  provider. Eating and drinking  Drink at least 8 eight-ounce glasses of water every day unless told not to by your health care provider. If you breastfeed, you may need to drink even more water.  Eat high-fiber foods every day. These foods may help prevent or relieve constipation. High-fiber foods include: ? Whole grain cereals and breads. ? Brown rice. ? Beans. ? Fresh fruits and vegetables. Activity   If possible, have someone help you care for your baby and help with household activities for at least a few days after you leave the hospital.  Return to your normal activities as told by your health care provider. Ask your health care provider what activities are safe for you.  Rest as much as possible. Try to rest or take a nap while your baby is sleeping.  Do not lift anything that is heavier than 10 lbs (4.5 kg), or the limit that you were told, until your health care provider says that it is safe.  Talk with your health care provider about when you can engage in sexual activity. This may depend on your: ? Risk of infection. ? How fast you heal. ? Comfort and desire to engage in sexual activity. General instructions  Do not use tampons or douches until your health care provider approves.  Wear loose, comfortable clothing and a supportive and well-fitting bra.  Keep your perineum clean and dry. Wipe from front to back when you use the toilet.  If you pass a blood clot, save it and call your health care provider to discuss. Do not flush blood clots down the toilet before you get instructions from your health care provider.  Keep all follow-up visits for you and your baby as told by your health care provider. This is important. Contact a health care provider if:  You have: ? A fever. ? Bad-smelling vaginal discharge. ? Pus or a bad smell coming from your incision. ? Difficulty or pain when urinating. ? A sudden increase or decrease in the frequency of your bowel  movements. ? More redness, swelling, or pain around your incision. ? More fluid or blood coming from your incision. ? A rash. ? Nausea. ? Little or no interest in activities you used to enjoy. ? Questions about caring for yourself or your baby.  Your incision feels warm to the touch.  Your breasts turn red or become painful or hard.  You feel unusually sad or worried.  You vomit.  You pass a blood clot from your vagina.  You urinate more than usual.  You are dizzy or light-headed. Get help right away if:  You have: ? Pain that does not go away or get better with medicine. ? Chest pain. ? Difficulty breathing. ? Blurred vision or spots in your vision. ? Thoughts about hurting yourself or your baby. ? New pain in your abdomen or in one of your legs. ? A severe headache.  You faint.  You bleed from your vagina so much that you fill more than one sanitary pad in one hour. Bleeding should not be heavier than your  heaviest period. Summary  After the procedure, it is common to have pain at your incision site, abdominal cramping, and slight bleeding from your vagina.  Check your incision area every day for signs of infection.  Tell your health care provider about any unusual symptoms.  Keep all follow-up visits for you and your baby as told by your health care provider. This information is not intended to replace advice given to you by your health care provider. Make sure you discuss any questions you have with your health care provider. Document Released: 04/27/2002 Document Revised: 02/11/2018 Document Reviewed: 02/11/2018 Elsevier Interactive Patient Education  2019 [employer].

## 2018-09-11 NOTE — Progress Notes (Signed)
Discharge instructions reviewed with the patient. Pt instructed to call and schedule 2 week appointment with Dr. Dalbert Garnet. Pt verbalized understanding of instructions and has no further questions at this time. Pt discharge with significant other instable condition.

## 2018-09-14 ENCOUNTER — Ambulatory Visit: Payer: Self-pay

## 2018-09-14 NOTE — Lactation Note (Signed)
This note was copied from a baby's chart. Lactation Consultation Note  Patient Name: Boy Anay Walter [id number] Date: 09/14/2018   Mom only bringing in approximately 2 oz when she comes in.  Discussed milk supply with mom.  Information given on how to increase her milk supply and discussed options with mom.  Stressed importance of 8 to 12 pumping sessions in 24 hours. Explained power pumping and super pumping, diet and galactogogue options, warmth, massage and hand expression. Discussed with Chrys Racer, OT when a good time to start putting [name] to the breast.  Mom, Manuela Schwartz and [facility] set up a breast feeding consult Wednesday, January 29th at 11 am in [facility].  When palpated mom's breast, they were hard, painful and warm to touch.  She said it had been 3 hours since she last pumped.  Breast pump kit and cleaning supplies given to keep at bedside and set up [employer].  Increased flange size to #27.  Warmth applied and breast massaged and continued to massage while mom pumped.  Mom's breasts were softer after pumping and she reports that they were no longer painful.  Mom pumped 25 ml after 20 minutes.  Encouraged mom to pump at bedside whenever she was here visiting for more than 2 hours.  Encouraged mom to call lactation with any questions, concerns or assistance. Maternal Data    Feeding Feeding Type: Breast Milk Nipple Type: Slow - flow  LATCH Score                   Interventions    Lactation Tools Discussed/Used Tools: Bottle;41F feeding tube / Syringe;Pump   Consult Status      Tinamarie, Przybylski 09/14/2018, 6:22 PM

## 2018-09-16 ENCOUNTER — Ambulatory Visit: Payer: Self-pay

## 2018-09-16 NOTE — Lactation Note (Signed)
This note was copied from a baby's chart. Lactation Consultation Note  Patient Name: Erica Huerta [id number] Date: 09/16/2018     Maternal Data    Feeding    LATCH Score                   Interventions    Lactation Tools Discussed/Used     Consult Status      [name] 09/16/2018, 12:07 PM

## 2018-09-17 ENCOUNTER — Ambulatory Visit: Payer: Self-pay

## 2018-09-17 NOTE — Lactation Note (Signed)
This note was copied from a baby's chart. Lactation Consultation Note  Patient Name: Erica Huerta Date: 09/17/2018     Maternal Data  Parent has been fitted for 90mm nipple shield. Nipples are erect, but baby keeps tongue to roof of mouth during feeding sessions. Parent is continuing to work on building milk supply. Flanges are still fitting well and parent has access to a pump at home.   Feeding Feeding Type: Formula Nipple Type: Slow - flow  LATCH Score                   Interventions    Lactation Tools Discussed/Used     Consult Status  Baby was too sleepy to nurse during this feeding. Pt needs help with oral functions and can benefit from suck training. LC suggested parent do kangaroo care with baby at breast, sucking on pacifier in order to build up to a lick and learn session.     ROSANN TRUEBA 09/17/2018, 9:40 AM

## 2018-09-18 ENCOUNTER — Ambulatory Visit: Payer: Self-pay

## 2018-09-18 NOTE — Lactation Note (Signed)
This note was copied from a baby's chart. Lactation Consultation Note  Patient Name: Erica Huerta [id number] Date: 09/18/2018 Reason for consult: Initial assessment   Maternal Data    Feeding Feeding Type: Breast Milk  LATCH Score Latch: Repeated attempts needed to sustain latch, nipple held in mouth throughout feeding, stimulation needed to elicit sucking reflex.  Audible Swallowing: A few with stimulation  Type of Nipple: Everted at rest and after stimulation  Comfort (Breast/Nipple): Soft / non-tender  Hold (Positioning): Full assist, staff holds infant at breast  LATCH Score: 6  Interventions Interventions: Assisted with latch;Adjust position;Breast compression;Support pillows;Position options  Lactation Tools Discussed/Used     Consult Status Consult Status: PRN Date: 09/18/18  LC assisted infant and mother with positioning in preparation for breastfeeding in the football hold. Infant licked and suckled a few times with audible swallows but seemed to quickly lose interest. Infant breastfed for about 5-10 minutes on each side changing from the football position to the cradle position and had periods of falling asleep. LC reassured mother that infant will do better with breastfeeding with time and lots of practice.   Arlyss Gandy 09/18/2018, 1:52 PM

## 2018-09-21 ENCOUNTER — Ambulatory Visit: Payer: Self-pay

## 2018-09-21 NOTE — Lactation Note (Signed)
This note was copied from a baby's chart. Lactation Consultation Note  Patient Name: Erica Huerta Today's Date: 09/21/2018   Assisted mom with breast feeding in SCN.  Positioned for comfort with pillow support and nursing stool.  Latched with minimal assistance without nipple shield.  Demonstrated how to keep massaging breast to keep [name] actively sucking at the breast.  He nursed for 10 minutes on right breast in cradle hold slightly elevated.  While she was breast feeding on right breast, she started leaking on left breast.  Caught leaking milk with some hand expressing into gradufeed for later.  He nursed for 10 minutes on right breast with strong rhythmic sucking and frequent swallowing before coming off.  While mom had [name] skin to skin to burp, he started rooting and sucking on hands.  She placed him on left breast where he fed for another 15 minutes.  He became less interested in continuing to suck, falling asleep at the breast, when tube feeding started.  Mom only bringing in around 4 oz when she comes once a day.  Mom reports only pumping around every 3 or probably more like 4 hrs at home using Medela DEBP with #27 size flanges she received through [employer] insurance.  Reminded her she had a kit here at the bedside and could pump while she was here visiting.  Encouraged mom to place [name] skin to skin as much as possible while visiting and breast feeding if here at feeding time.  Reviewed supply and demand and need for pumping and/or breast feeding 8 to 12 times in 24 hours to maintain milk supply.  Information sheet given on methods to increase milk supply and reviewed including warmth, massage, hand expression, diet changes, super and power pumping, galactogogues, etc.  Mom reports she ordered Fenugreek, but has not received it yet.  Stressed importance of draining breasts frequently if started taking galactogogues to prevent engorgement. Encouraged mom to call lactation with any  questions, concerns or assistance.  Maternal Data    Feeding    LATCH Score                   Interventions    Lactation Tools Discussed/Used     Consult Status      Courtois, Sandra Kay 09/21/2018, 9:03 PM    

## 2019-03-05 ENCOUNTER — Ambulatory Visit: Payer: Self-pay

## 2019-04-23 ENCOUNTER — Other Ambulatory Visit: Payer: Self-pay

## 2019-04-23 ENCOUNTER — Ambulatory Visit: Payer: Self-pay | Admitting: Physician Assistant

## 2019-04-23 ENCOUNTER — Encounter: Payer: Self-pay | Admitting: Physician Assistant

## 2019-04-23 DIAGNOSIS — Z113 Encounter for screening for infections with a predominantly sexual mode of transmission: Secondary | ICD-10-CM

## 2019-04-23 NOTE — Progress Notes (Signed)
    STI clinic/screening visit  Subjective:  Erica Huerta is a 28 y.o. female being seen today for an STI screening visit. The patient reports they do not have symptoms.  Patient has the following medical conditions:   Patient Active Problem List   Diagnosis Date Noted  . Intrauterine growth restriction affecting care of mother, antepartum, third trimester, not applicable or unspecified fetus 09/07/2018  . Preterm uterine contractions, antepartum, third trimester 08/26/2018  . Nausea/vomiting in pregnancy 06/23/2018  . Encounter for annual routine gynecological examination 07/04/2017     Chief Complaint  Patient presents with  . SEXUALLY TRANSMITTED DISEASE    HPI  Patient reports that she wants Chlamydia testing.  Declines blood work and all other screening.  Requests to self-collect.  Reports that she had a C-section in January and has an appt with PCP/Gyn for a Nexplanon insertion.  LMP 04/17/19 and normal.  See flowsheet for further details and programmatic requirements.    The following portions of the patient's history were reviewed and updated as appropriate: allergies, current medications, past medical history, past social history, past surgical history and problem list.  Objective:  There were no vitals filed for this visit.  Physical Exam Constitutional:      General: She is not in acute distress.    Appearance: Normal appearance.  HENT:     Head: Normocephalic.  Pulmonary:     Effort: Pulmonary effort is normal.  Neurological:     Mental Status: She is alert and oriented to person, place, and time.  Psychiatric:        Mood and Affect: Mood normal.        Behavior: Behavior normal.        Thought Content: Thought content normal.        Judgment: Judgment normal.       Assessment and Plan:  Erica Huerta is a 28 y.o. female presenting to the [facility] Department for STI screening  1. Screening for STD (sexually transmitted  disease Patient is without symptoms today.  Requests to self-collect NAAT for GC and Chlamydia only. Counseled patient that the test we have is a combo test for GC and Chlamydia.  Patient is OK with this.  Rec condoms with all sex Await test results.  Counseled that RN will call if needs to RTC for any treatment once results are back. - Chlamydia/Gonorrhea [facility]     No follow-ups on file.  No future appointments.  Jerene Dilling, PA

## 2019-09-02 ENCOUNTER — Telehealth: Payer: Self-pay | Admitting: Family Medicine

## 2019-09-02 NOTE — Telephone Encounter (Signed)
TC to patient. Verified ID via password/SS#. Informed of negative GC/Chlam. Richmond Campbell, RN

## 2020-05-18 IMAGING — US US OB LIMITED
1 series · 13 of 27 positions shown · non-contrast
Comparison: none

CLINICAL DATA: Acute onset of light vaginal bleeding.

EXAM:
LIMITED OBSTETRIC ULTRASOUND

[Series 1: us ob limited · 0.31mm/px · 13 of 27 slices shown]
[im 2/27]
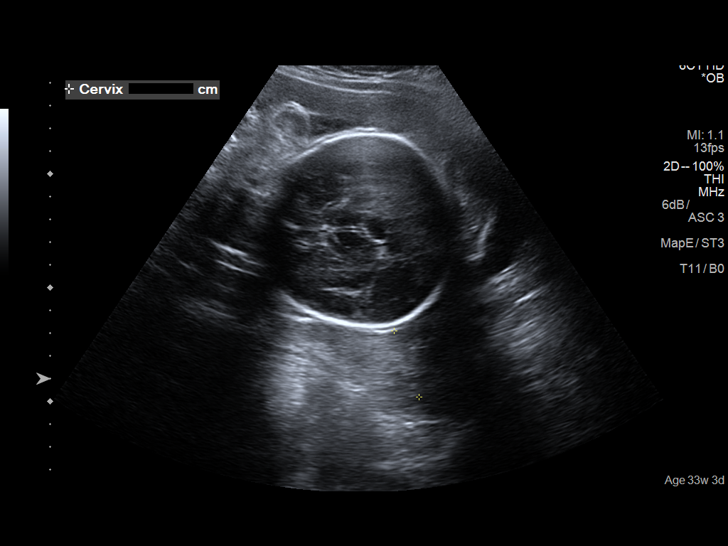
[im 4/27]
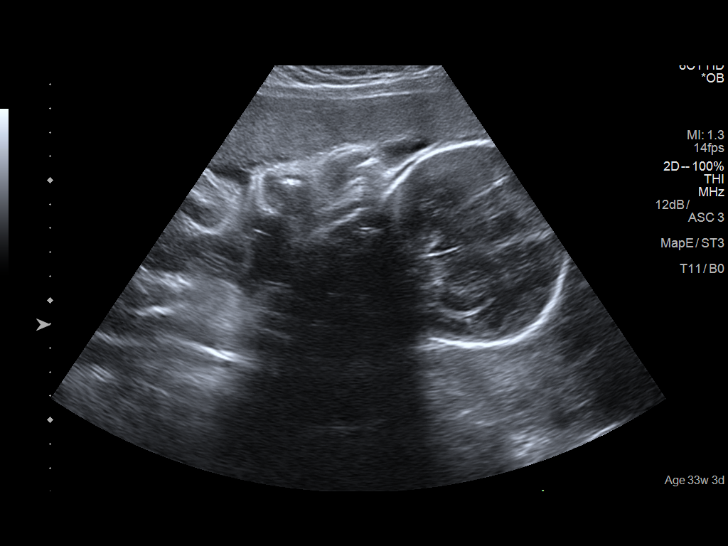
[im 6/27]
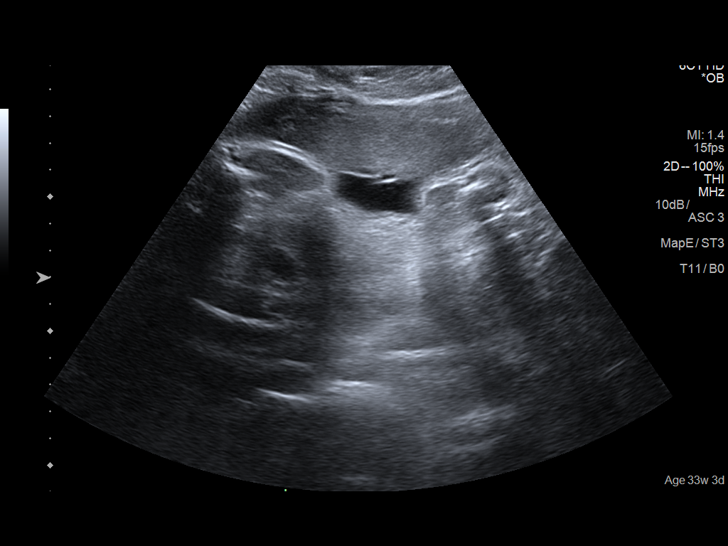
[im 8/27]
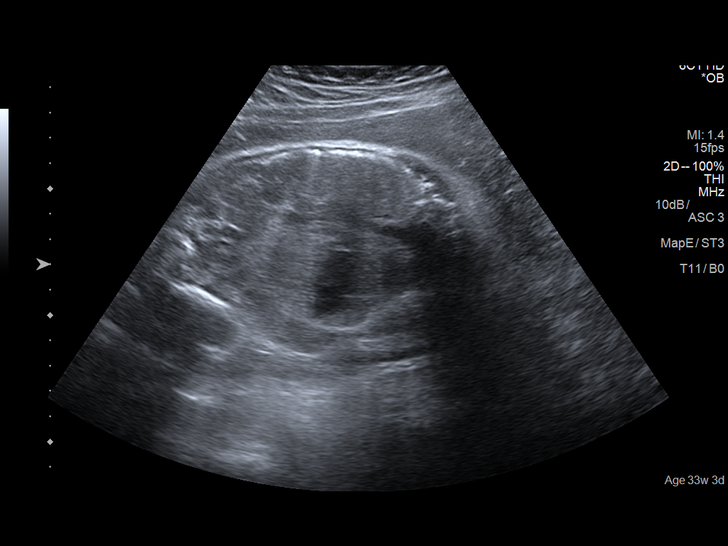
[im 10/27]
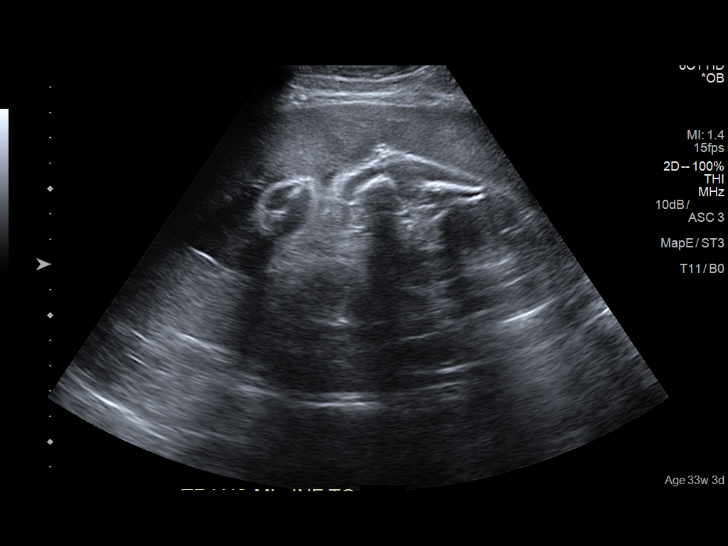
[im 12/27]
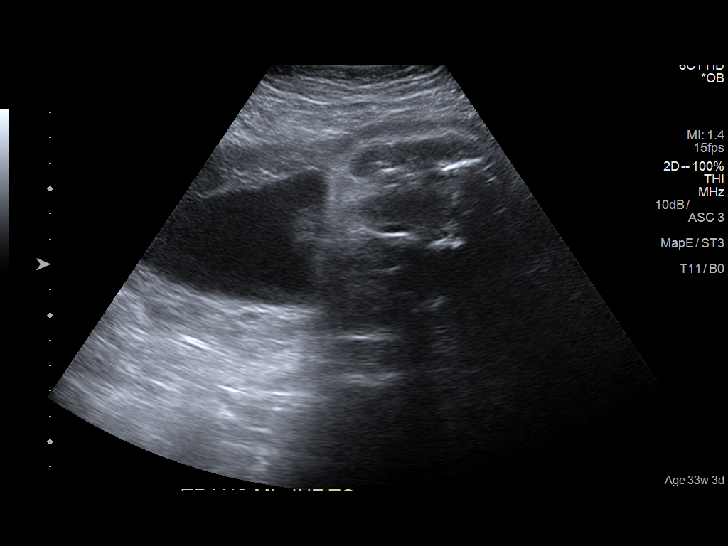
[im 14/27]
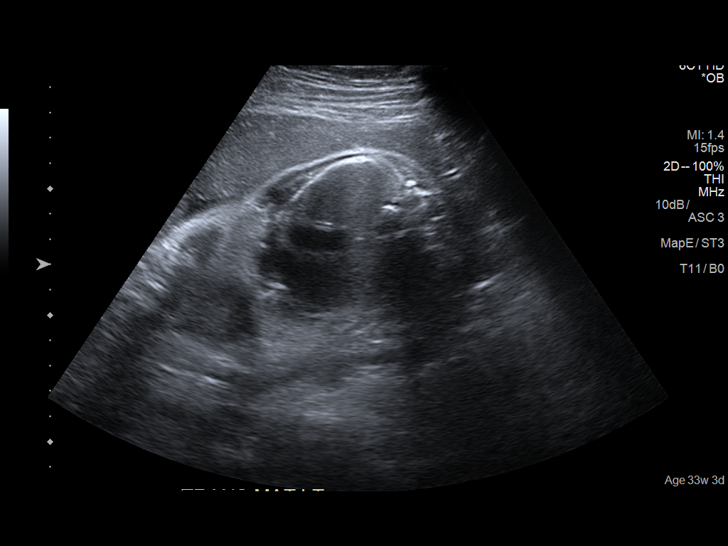
[im 16/27]
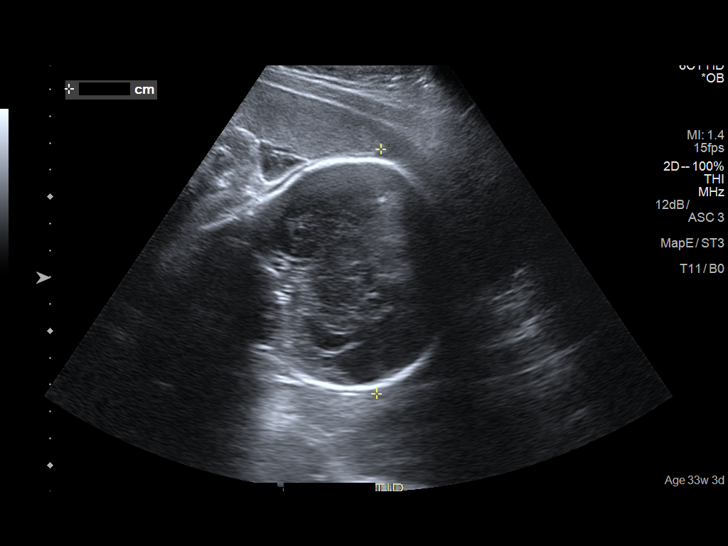
[im 18/27]
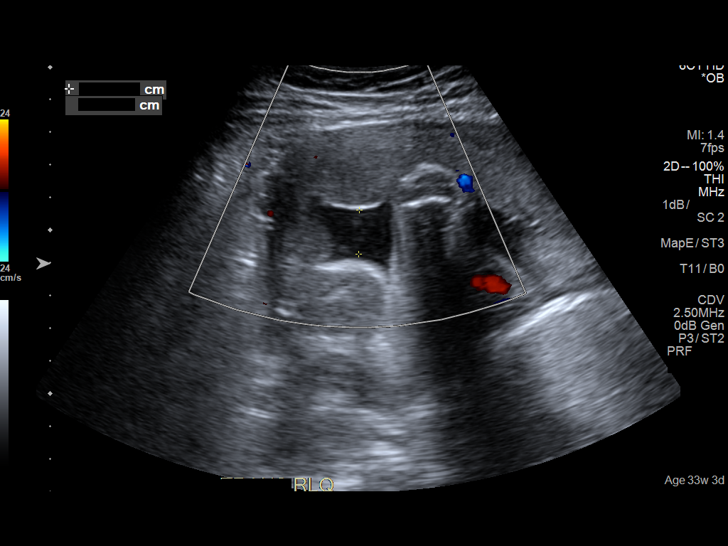
[im 20/27]
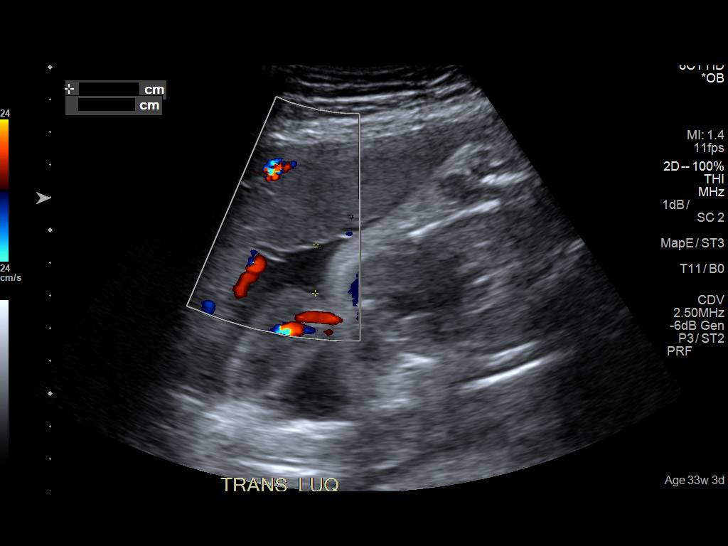
[im 22/27]
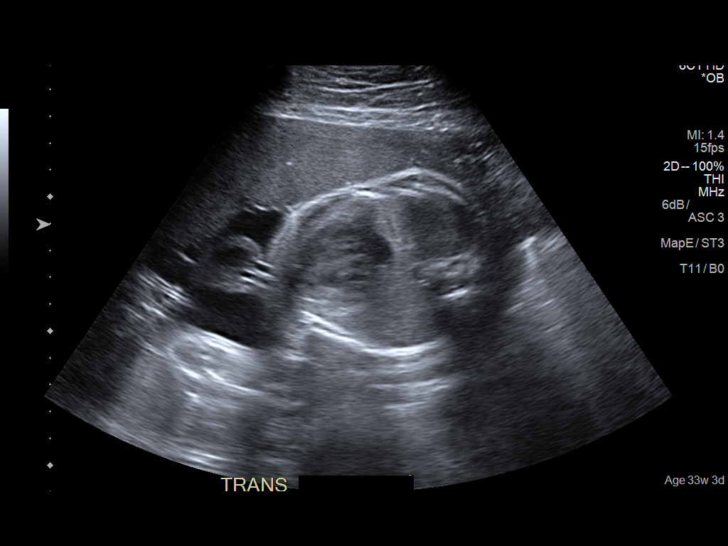
[im 24/27]
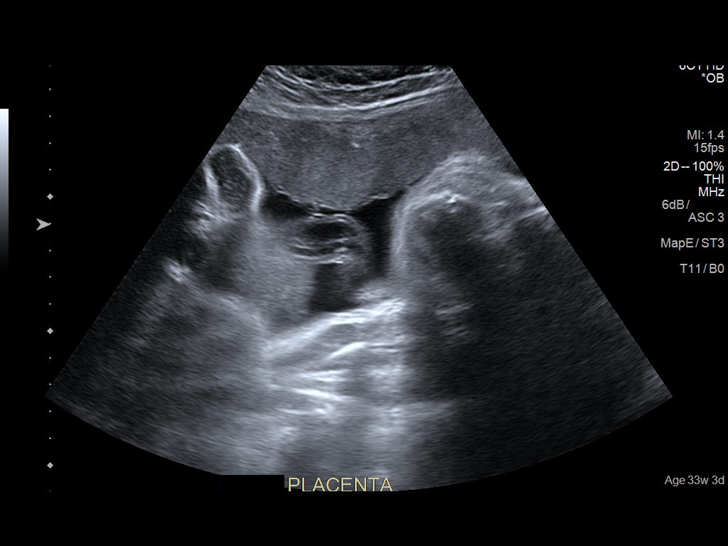
[im 26/27]
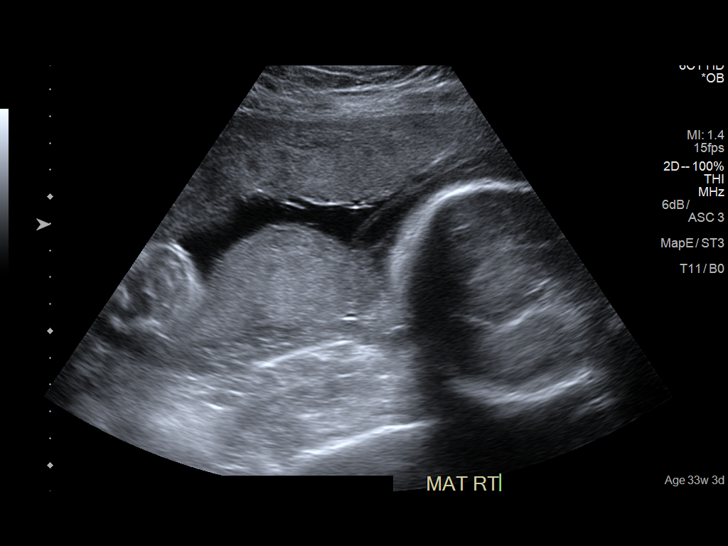

[13 of 27 positions shown; findings below may reference images not displayed]

FINDINGS: Number of Fetuses: 1

Heart Rate:  140 bpm

Movement: Yes

Presentation: Cephalic

Placental Location: Right anterolateral

Previa: No

Amniotic Fluid (Subjective):  Decreased

AFI: 6.3 cm

BPD: 8.26 cm 33 w  2 d

MATERNAL FINDINGS:

Cervix:  The cervix remains closed, measuring 3.1 cm in length.

Uterus/Adnexae: No abnormality visualized.
IMPRESSION: 1. Single live intrauterine pregnancy, with a biparietal diameter of
8.3 cm, corresponding to a gestational age of 33 weeks 2 days. This
matches the gestational age of 33 weeks 3 days by LMP, reflecting an
estimated date of delivery September 28, 2018.
2. Decreased amniotic fluid again noted; AFI now measures 6.3 cm,
compared to 6.7 cm on the prior study. This is less than
percentile.
3. Cervix remains closed, measuring 3.1 cm in length. No evidence of
placenta previa.

This exam is performed on an emergent basis and does not
comprehensively evaluate fetal size, dating, or anatomy; follow-up
complete OB US should be considered if further fetal assessment is
warranted.

These results were called by telephone at the time of interpretation
on 08/14/2018 at [DATE] to Yoel RN at [HOSPITAL] Labor and
Delivery, who verbally acknowledged these results.

## 2020-11-09 ENCOUNTER — Ambulatory Visit (LOCAL_COMMUNITY_HEALTH_CENTER): Payer: Self-pay

## 2020-11-09 ENCOUNTER — Other Ambulatory Visit: Payer: Self-pay

## 2020-11-09 VITALS — BP 108/75 | Ht 67.0 in | Wt 167.0 lb

## 2020-11-09 DIAGNOSIS — Z3201 Encounter for pregnancy test, result positive: Secondary | ICD-10-CM

## 2020-11-09 LAB — PREGNANCY, URINE: Preg Test, Ur: POSITIVE — AB

## 2020-11-09 MED ORDER — PRENATAL 27-0.8 MG PO TABS: 1.0000 | ORAL_TABLET | Freq: Every day | ORAL | 0 refills | Status: AC

## 2020-11-09 NOTE — Progress Notes (Signed)
UPT positive. Plans prenatal care at [facility] or [facility], pt unsure today. Encouraged to establish prenatal care soon. To clerk/DSS  for preadmit. Jerel Shepherd, RN

## 2022-07-10 ENCOUNTER — Emergency Department: Payer: BLUE CROSS/BLUE SHIELD

## 2022-07-10 ENCOUNTER — Emergency Department
Admission: EM | Admit: 2022-07-10 | Discharge: 2022-07-10 | Disposition: A | Discharge status: Home / Self Care | Payer: BLUE CROSS/BLUE SHIELD | Attending: Student in an Organized Health Care Education/Training Program | Admitting: Student in an Organized Health Care Education/Training Program

## 2022-07-10 ENCOUNTER — Encounter: Payer: Self-pay | Admitting: Emergency Medicine

## 2022-07-10 DIAGNOSIS — R1084 Generalized abdominal pain: Secondary | ICD-10-CM | POA: Insufficient documentation

## 2022-07-10 DIAGNOSIS — Y92410 Unspecified street and highway as the place of occurrence of the external cause: Secondary | ICD-10-CM | POA: Diagnosis not present

## 2022-07-10 DIAGNOSIS — R519 Headache, unspecified: Secondary | ICD-10-CM | POA: Insufficient documentation

## 2022-07-10 DIAGNOSIS — M542 Cervicalgia: Secondary | ICD-10-CM | POA: Diagnosis present

## 2022-07-10 DIAGNOSIS — S161XXA Strain of muscle, fascia and tendon at neck level, initial encounter: Secondary | ICD-10-CM | POA: Diagnosis not present

## 2022-07-10 HISTORY — DX: Anxiety disorder, unspecified: F41.9

## 2022-07-10 HISTORY — DX: Depression, unspecified: F32.A

## 2022-07-10 LAB — BASIC METABOLIC PANEL
Anion gap: 9 (ref 5–15)
BUN: 15 mg/dL (ref 6–20)
CO2: 19 mmol/L — ABNORMAL LOW (ref 22–32)
Calcium: 9.3 mg/dL (ref 8.9–10.3)
Chloride: 109 mmol/L (ref 98–111)
Creatinine, Ser: 0.81 mg/dL (ref 0.44–1.00)
GFR, Estimated: >60 mL/min (ref >60)
Glucose, Bld: 100 mg/dL — ABNORMAL HIGH (ref 70–99)
Potassium: 3.5 mmol/L (ref 3.5–5.1)
Sodium: 137 mmol/L (ref 135–145)

## 2022-07-10 LAB — CBC
HCT: 39.7 % (ref 36.0–46.0)
Hemoglobin: 13.1 g/dL (ref 12.0–15.0)
MCH: 28.9 pg (ref 26.0–34.0)
MCHC: 33 g/dL (ref 30.0–36.0)
MCV: 87.6 fL (ref 80.0–100.0)
Platelets: 278 10*3/uL (ref 150–400)
RBC: 4.53 MIL/uL (ref 3.87–5.11)
RDW: 12.6 % (ref 11.5–15.5)
WBC: 6 10*3/uL (ref 4.0–10.5)
nRBC: 0 % (ref 0.0–0.2)

## 2022-07-10 LAB — POC URINE PREG, ED: Preg Test, Ur: NEGATIVE

## 2022-07-10 LAB — TROPONIN I (HIGH SENSITIVITY): Troponin I (High Sensitivity): <2 ng/L (ref <18)

## 2022-07-10 MED ORDER — ONDANSETRON HCL 4 MG/2ML IJ SOLN
4.0000 mg | Freq: Once | INTRAMUSCULAR | Status: AC
  Administered 2022-07-10: 4 mg via INTRAVENOUS
  Filled 2022-07-10: qty 2

## 2022-07-10 MED ORDER — MORPHINE SULFATE (PF) 4 MG/ML IV SOLN
4.0000 mg | INTRAVENOUS | Status: DC | PRN
  Administered 2022-07-10: 4 mg via INTRAVENOUS
  Filled 2022-07-10: qty 1

## 2022-07-10 MED ORDER — IOHEXOL 350 MG/ML SOLN
75.0000 mL | Freq: Once | INTRAVENOUS | Status: AC | PRN
  Administered 2022-07-10: 75 mL via INTRAVENOUS

## 2022-07-10 MED ORDER — CYCLOBENZAPRINE HCL 5 MG PO TABS: 5.0000 mg | ORAL_TABLET | Freq: Three times a day (TID) | ORAL | 0 refills | Status: DC | PRN

## 2022-07-10 NOTE — ED Notes (Signed)
Pt received discharge papers and verbalized understanding of discharge instructions. Iv removed. Pt ambulates to exit with steady gait accompanied by family member
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
## 2022-10-30 ENCOUNTER — Encounter: Payer: Self-pay | Admitting: Emergency Medicine

## 2022-10-30 ENCOUNTER — Ambulatory Visit
Admission: EM | Admit: 2022-10-30 | Discharge: 2022-10-30 | Disposition: A | Discharge status: Home / Self Care | Payer: BLUE CROSS/BLUE SHIELD | Attending: Family Medicine | Admitting: Family Medicine

## 2022-10-30 DIAGNOSIS — Z20822 Contact with and (suspected) exposure to covid-19: Secondary | ICD-10-CM | POA: Insufficient documentation

## 2022-10-30 DIAGNOSIS — N920 Excessive and frequent menstruation with regular cycle: Secondary | ICD-10-CM | POA: Insufficient documentation

## 2022-10-30 DIAGNOSIS — K529 Noninfective gastroenteritis and colitis, unspecified: Secondary | ICD-10-CM | POA: Diagnosis not present

## 2022-10-30 DIAGNOSIS — R197 Diarrhea, unspecified: Secondary | ICD-10-CM | POA: Diagnosis present

## 2022-10-30 LAB — CBC WITH DIFFERENTIAL/PLATELET
Abs Immature Granulocytes: 0.01 10*3/uL (ref 0.00–0.07)
Basophils Absolute: 0 10*3/uL (ref 0.0–0.1)
Basophils Relative: 1 %
Eosinophils Absolute: 0.1 10*3/uL (ref 0.0–0.5)
Eosinophils Relative: 3 %
HCT: 41.8 % (ref 36.0–46.0)
Hemoglobin: 13.8 g/dL (ref 12.0–15.0)
Immature Granulocytes: 0 %
Lymphocytes Relative: 38 %
Lymphs Abs: 1.6 10*3/uL (ref 0.7–4.0)
MCH: 29.4 pg (ref 26.0–34.0)
MCHC: 33 g/dL (ref 30.0–36.0)
MCV: 89.1 fL (ref 80.0–100.0)
Monocytes Absolute: 0.3 10*3/uL (ref 0.1–1.0)
Monocytes Relative: 8 %
Neutro Abs: 2.1 10*3/uL (ref 1.7–7.7)
Neutrophils Relative %: 50 %
Platelets: 255 10*3/uL (ref 150–400)
RBC: 4.69 MIL/uL (ref 3.87–5.11)
RDW: 13.2 % (ref 11.5–15.5)
WBC: 4.2 10*3/uL (ref 4.0–10.5)
nRBC: 0 % (ref 0.0–0.2)

## 2022-10-30 LAB — COMPREHENSIVE METABOLIC PANEL
ALT: 13 U/L (ref 0–44)
AST: 25 U/L (ref 15–41)
Albumin: 4.3 g/dL (ref 3.5–5.0)
Alkaline Phosphatase: 72 U/L (ref 38–126)
Anion gap: 5 (ref 5–15)
BUN: 11 mg/dL (ref 6–20)
CO2: 25 mmol/L (ref 22–32)
Calcium: 8.5 mg/dL — ABNORMAL LOW (ref 8.9–10.3)
Chloride: 104 mmol/L (ref 98–111)
Creatinine, Ser: 0.69 mg/dL (ref 0.44–1.00)
GFR, Estimated: >60 mL/min (ref >60)
Glucose, Bld: 98 mg/dL (ref 70–99)
Potassium: 3.6 mmol/L (ref 3.5–5.1)
Sodium: 134 mmol/L — ABNORMAL LOW (ref 135–145)
Total Bilirubin: 0.9 mg/dL (ref 0.3–1.2)
Total Protein: 8.2 g/dL — ABNORMAL HIGH (ref 6.5–8.1)

## 2022-10-30 LAB — RESP PANEL BY RT-PCR (RSV, FLU A&B, COVID)  RVPGX2
Influenza A by PCR: NEGATIVE
Influenza B by PCR: NEGATIVE
Resp Syncytial Virus by PCR: NEGATIVE
SARS Coronavirus 2 by RT PCR: NEGATIVE

## 2022-10-30 MED ORDER — ONDANSETRON HCL 4 MG PO TABS: 4.0000 mg | ORAL_TABLET | Freq: Four times a day (QID) | ORAL | 0 refills | Status: DC

## 2022-10-30 MED ORDER — ONDANSETRON HCL 4 MG/2ML IJ SOLN
4.0000 mg | Freq: Once | INTRAMUSCULAR | Status: AC
  Administered 2022-10-30: 4 mg via INTRAMUSCULAR

## 2022-10-30 NOTE — Discharge Instructions (Addendum)
You are not anemic.  Your blood work did not show signs of infection, kidney, liver or electrolyte disturbance.  Your COVID, influenza and RSV test were all negative.  I suspect you have a viral illness that is causing your symptoms.  It would likely improve over the next coming days.  I sent some Zofran, which was a medicine you were given here as well, to your pharmacy.  It is important that you remain hydrated.  Be sure to monitor your urine color and volume when you go to the bathroom.

## 2022-10-30 NOTE — ED Triage Notes (Signed)
Pt c/o several cases of diarrhea over the past 2 days with chills.

## 2022-10-30 NOTE — ED Provider Notes (Signed)
[facility]    CSN: [id number] Arrival date & time: 10/30/22  1053      History   Chief Complaint Chief Complaint  Patient presents with   Diarrhea   Chills    HPI Erica Huerta is a 32 y.o. female.   HPI   Erica Huerta presents for decreased appetite and body aches fatigue, chills that started Monday.  Diarrhea started this morning.  She is trying to stay hydrated. Yesterday,  she chicken noodle soup and spaghetti. No one else ate the same food. Her best friend had diarrhea last week and she then was diagnosed with COVID. Has nausea. Denies fever and vomiting. Son has cough and rhinorrhea.  She is having a heavy period. She recently threw her Depo shot in the trash by accident while cleaning out the car.  Patient's last menstrual period was 10/28/2022.      Past Medical History:  Diagnosis Date   Anxiety    Depression    Patient denies medical problems     Patient Active Problem List   Diagnosis Date Noted   Intrauterine growth restriction affecting care of mother, antepartum, third trimester, not applicable or unspecified fetus 09/07/2018   Preterm uterine contractions, antepartum, third trimester 08/26/2018   Nausea/vomiting in pregnancy 06/23/2018   Encounter for annual routine gynecological examination 07/04/2017    Past Surgical History:  Procedure Laterality Date   CESAREAN SECTION N/A 09/08/2018   Procedure: CESAREAN SECTION;  Surgeon: Benjaman Kindler, MD;  Location: [facility] ORS;  Service: Obstetrics;  Laterality: N/A;   NO PAST SURGERIES      OB History     Gravida  3   Para  1   Term  1   Preterm  0   AB  1   Living  0      SAB  0   IAB  1   Ectopic  0   Multiple  0   Live Births  0            Home Medications    Prior to Admission medications   Medication Sig Start Date End Date Taking? Authorizing Provider  clonazePAM (KLONOPIN) 1 MG tablet Take 0.5-1 mg by mouth 2 (two) times daily as needed. 07/08/22  Yes  [provider]  ondansetron (ZOFRAN) 4 MG tablet Take 1 tablet (4 mg total) by mouth every 6 (six) hours. 10/30/22  Yes Mellony Danziger, DO  Prenat-FeFum-DSS-FA-DHA w/o A (PNV-DHA+DOCUSATE) 27-1.25-300 MG CAPS Take 1 capsule by mouth daily.   Yes [provider]  cyclobenzaprine (FLEXERIL) 5 MG tablet Take 1 tablet (5 mg total) by mouth 3 (three) times daily as needed for muscle spasms. 07/10/22   [name], MD  ibuprofen (ADVIL,MOTRIN) 800 MG tablet Take 1 tablet (800 mg total) by mouth every 6 (six) hours. Patient not taking: Reported on 11/09/2020 09/11/18   Ward, Honor Loh, MD  oxyCODONE (OXY IR/ROXICODONE) 5 MG immediate release tablet Take 1 tablet (5 mg total) by mouth every 4 (four) hours as needed for moderate pain. Patient not taking: Reported on 11/09/2020 09/11/18   Ward, Honor Loh, MD  Prenatal Vit-Fe Fumarate-FA (PRENATAL MULTIVITAMIN) TABS tablet Take 1 tablet by mouth daily at 12 noon. Patient not taking: Reported on 11/09/2020    [provider]    Family History Family History  Problem Relation Age of Onset   Anxiety disorder Mother    Depression Mother    Thyroid disease Mother     Social History  Social History   Tobacco Use   Smoking status: Never   Smokeless tobacco: Never  Vaping Use   Vaping Use: Never used  Substance Use Topics   Alcohol use: Not Currently    Comment: last use 10/26/20- wine   Drug use: No     Allergies   Patient has no known allergies.   Review of Systems Review of Systems: negative unless otherwise stated in HPI.      Physical Exam Triage Vital Signs ED Triage Vitals  Enc Vitals Group     BP 10/30/22 1210 108/84     Pulse Rate 10/30/22 1210 67     Resp 10/30/22 1210 18     Temp 10/30/22 1210 98.3 F (36.8 C)     Temp Source 10/30/22 1210 Oral     SpO2 10/30/22 1210 100 %     Weight --      Height --      Head Circumference --      Peak Flow --      Pain Score 10/30/22 1209 6     Pain  Loc --      Pain Edu? --      Excl. in [place]? --    No data found.  Updated Vital Signs BP 108/84 (BP Location: Left Arm)   Pulse 67   Temp 98.3 F (36.8 C) (Oral)   Resp 18   LMP 10/28/2022   SpO2 100%   Visual Acuity Right Eye Distance:   Left Eye Distance:   Bilateral Distance:    Right Eye Near:   Left Eye Near:    Bilateral Near:     Physical Exam GEN:     alert, ill but non-toxic appearing female in no distress    HENT:  mucus membranes moist, oropharyngeal without lesions or exudate, no tonsillar hypertrophy, mild oropharyngeal erythema,  no nasal discharge EYES:   pupils equal and reactive, no scleral injection or discharge NECK:  normal ROM   RESP:  no increased work of breathing, clear to auscultation bilaterally CVS:   regular rate and rhythm ABD:  soft, non-tender in all quadrants, non-distended, active bowel sounds, no guarding, no rebound, negative Murphy, negative McBurney's  Skin:   warm and dry, no rash on visible skin    UC Treatments / Results  Labs (all labs ordered are listed, but only abnormal results are displayed) Labs Reviewed  COMPREHENSIVE METABOLIC PANEL - Abnormal; Notable for the following components:      Result Value   Sodium 134 (*)    Calcium 8.5 (*)    Total Protein 8.2 (*)    All other components within normal limits  RESP PANEL BY RT-PCR (RSV, FLU A&B, COVID)  RVPGX2  CBC WITH DIFFERENTIAL/PLATELET    EKG   Radiology No results found.  Procedures Procedures (including critical care time)  Medications Ordered in UC Medications  ondansetron (ZOFRAN) injection 4 mg (4 mg Intramuscular Given 10/30/22 1239)  ondansetron (ZOFRAN) injection 4 mg (4 mg Intramuscular Given 10/30/22 1239)    Initial Impression / Assessment and Plan / UC Course  I have reviewed the triage vital signs and the nursing notes.  Pertinent labs & imaging results that were available during my care of the patient were reviewed by me and considered in my  medical decision making (see chart for details).       Pt is a 32 y.o. female who presents for 2 days of GI symptoms. Erica Huerta is afebrile here without  recent antipyretics. Satting well on room air. Overall pt is ill but non-toxic appearing, well hydrated, without respiratory distress. Abdominal exam is unremarkable.  Obtain CBC, CMP and lipase.  COVID and influenza testing also obtained.  Given IM Zofran 8 mg for nausea.   COVID, influenza, RSV are all negative.  CMP is grossly unremarkable.  CBC is normal. History most consistent with viral  illness. I suspect patient has a viral gastroenteritis.  She has had some improvement in her discomfort with Zofran.  Zofran oral medication sent to her preferred pharmacy.Discussed symptomatic treatment.  Explained lack of efficacy of antibiotics in viral disease.  Typical duration of symptoms discussed.   Return and ED precautions given and voiced understanding. Discussed MDM, treatment plan and plan for follow-up with patient who agrees with plan.     Final Clinical Impressions(s) / UC Diagnoses   Final diagnoses:  Gastroenteritis  Menorrhagia with regular cycle     Discharge Instructions      You are not anemic.  Your blood work did not show signs of infection, kidney, liver or electrolyte disturbance.  Your COVID, influenza and RSV test were all negative.  I suspect you have a viral illness that is causing your symptoms.  It would likely improve over the next coming days.  I sent some Zofran, which was a medicine you were given here as well, to your pharmacy.  It is important that you remain hydrated.  Be sure to monitor your urine color and volume when you go to the bathroom.     ED Prescriptions     Medication Sig Dispense Auth. Provider   ondansetron (ZOFRAN) 4 MG tablet Take 1 tablet (4 mg total) by mouth every 6 (six) hours. 20 tablet Lyndee Hensen, DO      PDMP not reviewed this encounter.   Lyndee Hensen, DO 10/30/22  1937

## 2023-04-23 ENCOUNTER — Ambulatory Visit: Payer: BLUE CROSS/BLUE SHIELD

## 2023-05-19 ENCOUNTER — Other Ambulatory Visit: Payer: Self-pay

## 2023-05-19 ENCOUNTER — Emergency Department
Admission: EM | Admit: 2023-05-19 | Discharge: 2023-05-19 | Disposition: A | Discharge status: Home / Self Care | Payer: BLUE CROSS/BLUE SHIELD | Attending: Emergency Medicine | Admitting: Emergency Medicine

## 2023-05-19 ENCOUNTER — Emergency Department: Payer: BLUE CROSS/BLUE SHIELD

## 2023-05-19 DIAGNOSIS — M542 Cervicalgia: Secondary | ICD-10-CM | POA: Diagnosis not present

## 2023-05-19 DIAGNOSIS — M546 Pain in thoracic spine: Secondary | ICD-10-CM | POA: Diagnosis not present

## 2023-05-19 DIAGNOSIS — R519 Headache, unspecified: Secondary | ICD-10-CM | POA: Insufficient documentation

## 2023-05-19 DIAGNOSIS — Y92410 Unspecified street and highway as the place of occurrence of the external cause: Secondary | ICD-10-CM | POA: Diagnosis not present

## 2023-05-19 MED ORDER — ACETAMINOPHEN 500 MG PO TABS
1000.0000 mg | ORAL_TABLET | Freq: Once | ORAL | Status: AC
  Administered 2023-05-19: 1000 mg via ORAL
  Filled 2023-05-19: qty 2
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
## 2023-05-19 NOTE — ED Notes (Signed)
Patient transported to X-ray; going to CT after per Radiology Tech.Marland Kitchen
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
## 2023-06-04 ENCOUNTER — Ambulatory Visit: Payer: BLUE CROSS/BLUE SHIELD

## 2023-06-04 ENCOUNTER — Encounter: Payer: Self-pay | Admitting: Advanced Practice Midwife

## 2023-06-04 ENCOUNTER — Ambulatory Visit (LOCAL_COMMUNITY_HEALTH_CENTER): Payer: BLUE CROSS/BLUE SHIELD | Admitting: Advanced Practice Midwife

## 2023-06-04 VITALS — BP 122/69 | HR 85 | Ht 67.0 in | Wt 170.0 lb

## 2023-06-04 DIAGNOSIS — Z3009 Encounter for other general counseling and advice on contraception: Secondary | ICD-10-CM

## 2023-06-04 DIAGNOSIS — Z30011 Encounter for initial prescription of contraceptive pills: Secondary | ICD-10-CM | POA: Diagnosis not present

## 2023-06-04 DIAGNOSIS — Z308 Encounter for other contraceptive management: Secondary | ICD-10-CM | POA: Diagnosis not present

## 2023-06-04 LAB — WET PREP FOR TRICH, YEAST, CLUE: Trichomonas Exam: NEGATIVE

## 2023-06-04 LAB — HM HIV SCREENING LAB: HM HIV Screening: NEGATIVE

## 2023-06-04 MED ORDER — NORGESTIM-ETH ESTRAD TRIPHASIC 0.18/0.215/0.25 MG-25 MCG PO TABS
1.0000 | ORAL_TABLET | Freq: Every day | ORAL | 13 refills | Status: DC
Start: 2023-06-04 — End: 2024-04-23

## 2023-06-04 NOTE — Progress Notes (Signed)
Pt is here for family planning visit.  Family planning packet reviewed and given to pt.  Wet prep results reviewed, no treatment required per standing orders. Condoms declined. Gaspar Garbe, RN

## 2023-06-04 NOTE — Progress Notes (Signed)
[facility] DEPARTMENT [facility] [address] Main Number: [phone]   Family Planning Visit- Initial Visit  Subjective:  Erica Huerta is a 32 y.o. SBF nonsmoker  [id number] (4 yo son)  being seen today for an initial annual visit and to discuss reproductive life planning.  The patient is currently using Female Condom for pregnancy prevention. Patient reports she/her/hers  does not want a pregnancy in the next year.    she/her/hers report they are looking for a method that provides High efficacy at preventing pregnancy  Patient has the following medical conditions has Encounter for annual routine gynecological examination; Nausea/vomiting in pregnancy; Preterm uterine contractions, antepartum, third trimester; and Intrauterine growth restriction affecting care of mother, antepartum, third trimester, not applicable or unspecified fetus on their problem list.  Chief Complaint  Patient presents with   Annual Exam    And ocp    Patient reports here for physical and ocp's. LMP 05/17/23. Last sex 06/01/23 with condom; with current partner x 1 year; 1 partner in last 3 mo. Not working. Will start online school with [employer] on 06/16/23. Last ETOH 05/31/23 (2 wine coolers) 1x/mo. Last dental exam 03/2023. Last pap 07/04/17 neg.  Patient denies cigs, vaping, cigars, MJ  Body mass index is 26.63 kg/m. - Patient is eligible for diabetes screening based on BMI> 25 and age >35?  not applicable HA1C ordered? not applicable  Patient reports 1  partner/s in last year. Desires STI screening?  No - declines bloodwork  Has patient been screened once for HCV in the past?  No  No results found for: "HCVAB"  Does the patient have current drug use (including MJ), have a partner with drug use, and/or has been incarcerated since last result? No  If yes-- Screen for HCV through [facility]   Does the patient meet criteria for HBV testing?  No  Criteria:  -Household, sexual or needle sharing contact with HBV -History of drug use -HIV positive -Those with known Hep C   Health Maintenance Due  Topic Date Due   Hepatitis C Screening  Never done   DTaP/Tdap/Td (1 - Tdap) Never done   Cervical Cancer Screening (HPV/Pap Cotest)  07/04/2020   INFLUENZA VACCINE  03/20/2023   COVID-19 Vaccine (1 - 2023-24 season) Never done    Review of Systems  All other systems reviewed and are negative.   The following portions of the patient's history were reviewed and updated as appropriate: allergies, current medications, past family history, past medical history, past social history, past surgical history and problem list. Problem list updated.   See flowsheet for other program required questions.  Objective:   Vitals:   06/04/23 1558  BP: 122/69  Pulse: 85  Weight: 170 lb (77.1 kg)  Height: 5\' 7"  (1.702 m)    Physical Exam Constitutional:      Appearance: Normal appearance. She is normal weight.  HENT:     Head: Normocephalic and atraumatic.     Mouth/Throat:     Mouth: Mucous membranes are moist.     Comments: Last dental exam 03/2023 Eyes:     Conjunctiva/sclera: Conjunctivae normal.  Neck:     Thyroid: No thyroid mass, thyromegaly or thyroid tenderness.  Cardiovascular:     Rate and Rhythm: Normal rate and regular rhythm.  Pulmonary:     Effort: Pulmonary effort is normal.     Breath sounds: Normal breath sounds.  Chest:  Breasts:  Right: Normal.     Left: Normal.  Abdominal:     Palpations: Abdomen is soft.     Comments: Soft without masses or tenderness, good tone  Genitourinary:    General: Normal vulva.     Exam position: Lithotomy position.     Pubic Area: No pubic lice.      Vagina: Vaginal discharge (white creamy leukorrhea, ph<4.5) present.     Cervix: Normal.     Uterus: Normal.      Adnexa: Right adnexa normal and left adnexa normal.     Rectum: Normal.     Comments: No evidence of  nits Pap done Musculoskeletal:        General: Normal range of motion.     Cervical back: Normal range of motion and neck supple.  Skin:    General: Skin is warm and dry.  Neurological:     Mental Status: She is alert.  Psychiatric:        Mood and Affect: Mood normal.       Assessment and Plan:  [name] is a 33 y.o. female presenting to the [facility] Department for an initial annual wellness/contraceptive visit  Contraception counseling: Reviewed options based on patient desire and reproductive life plan. Patient is interested in Oral Contraceptive. This was provided to the patient today.  if not why not clearly documented  Risks, benefits, and typical effectiveness rates were reviewed.  Questions were answered.  Written information was also given to the patient to review.    The patient will follow up in  1 years for surveillance.  The patient was told to call with any further questions, or with any concerns about this method of contraception.  Emphasized use of condoms 100% of the time for STI prevention.  Educated on ECP and assessed for need of ECP. Patient reported not meeting criteria.  Reviewed options and patient desired No method of ECP, declined all    1. Family planning Treat wet mount per standing orders Immunization nurse consult  - HIV [facility] - Syphilis Serology, [facility] - WET PREP FOR TRICH, YEAST, CLUE - Chlamydia/Gonorrhea [place] [facility] - IGP, Aptima HPV  2. Encounter for initial prescription of contraceptive pills E-rx Tri Lo Sprintec with 13 RF Counseled to take daily starting tomorrow Counseled abstinance next 7 days  - Norgestimate-Ethinyl Estradiol Triphasic (TRI-LO-SPRINTEC) 0.18/0.215/0.25 MG-25 MCG tab; Take 1 tablet by mouth daily.  Dispense: 28 tablet; Refill: 13   No follow-ups on file.  No future appointments.  Alberteen Spindle, CNM

## 2023-06-10 LAB — IGP, APTIMA HPV
HPV Aptima: NEGATIVE
PAP Smear Comment: 0

## 2023-08-06 NOTE — Addendum Note (Signed)
Addended by: Heywood Bene on: 08/06/2023 03:20 PM   Modules accepted: Orders

## 2023-08-18 ENCOUNTER — Ambulatory Visit
Admission: EM | Admit: 2023-08-18 | Discharge: 2023-08-18 | Disposition: A | Discharge status: Home / Self Care | Payer: BLUE CROSS/BLUE SHIELD | Attending: Emergency Medicine | Admitting: Emergency Medicine

## 2023-08-18 DIAGNOSIS — M436 Torticollis: Secondary | ICD-10-CM | POA: Insufficient documentation

## 2023-08-18 DIAGNOSIS — M62830 Muscle spasm of back: Secondary | ICD-10-CM | POA: Diagnosis not present

## 2023-08-18 LAB — PREGNANCY, URINE: Preg Test, Ur: NEGATIVE

## 2023-08-18 MED ORDER — METAXALONE 800 MG PO TABS: 800.0000 mg | ORAL_TABLET | Freq: Three times a day (TID) | ORAL | 0 refills | Status: DC

## 2023-08-18 MED ORDER — METHYLPREDNISOLONE 4 MG PO TBPK
ORAL_TABLET | Freq: Every day | ORAL | 0 refills | Status: DC
Start: 2023-08-18 — End: 2024-06-10

## 2023-08-18 MED ORDER — NAPROXEN 500 MG PO TABS
500.0000 mg | ORAL_TABLET | Freq: Two times a day (BID) | ORAL | 0 refills | Status: DC
Start: 2023-08-18 — End: 2024-06-10

## 2023-08-18 MED ORDER — ACETAMINOPHEN 325 MG PO TABS
975.0000 mg | ORAL_TABLET | Freq: Once | ORAL | Status: AC
  Administered 2023-08-18: 975 mg via ORAL

## 2023-08-18 MED ORDER — KETOROLAC TROMETHAMINE 30 MG/ML IJ SOLN
30.0000 mg | Freq: Once | INTRAMUSCULAR | Status: AC
  Administered 2023-08-18: 30 mg via INTRAMUSCULAR

## 2023-08-18 MED ORDER — TIZANIDINE HCL 4 MG PO TABS: 4.0000 mg | ORAL_TABLET | Freq: Three times a day (TID) | ORAL | 0 refills | Status: DC | PRN

## 2023-08-18 NOTE — Discharge Instructions (Addendum)
Urine pregnancy is negative.  I have given you 30 mg of Toradol and 975 mg of Tylenol here today.  Finish the Medrol Dosepak, take the Naprosyn combined with 1000 mg of Tylenol twice a day.  May take an additional 1000 mg of Tylenol 1 more time per day.  Skelaxin, and if this is too expensive, then Zanaflex.  These are both effective for muscle relaxants.  Deep tissue massage, gentle stretching, heating pad, hot showers.  I have written you a work note for several days.  You may return sooner if improving.

## 2023-08-18 NOTE — ED Triage Notes (Addendum)
Patient states that she woke up this morning her neck upper back  and shoulders hurt. Can't turn neck. Feels like something is pinched. States that she can't move her arms up. Theres tingling down her arms. More so on the right side

## 2023-08-18 NOTE — ED Provider Notes (Incomplete)
HPI  SUBJECTIVE:  Erica Huerta is a 32 y.o. female who presents with ***    Past Medical History:  Diagnosis Date   Anxiety    Depression    Patient denies medical problems     Past Surgical History:  Procedure Laterality Date   CESAREAN SECTION N/A 09/08/2018   Procedure: CESAREAN SECTION;  Surgeon: Christeen Douglas, MD;  Location: [facility] ORS;  Service: Obstetrics;  Laterality: N/A;   NO PAST SURGERIES      Family History  Problem Relation Age of Onset   Anxiety disorder Mother    Depression Mother    Thyroid disease Mother    Healthy Father    Healthy Maternal Grandmother    Dementia Paternal Grandmother    Prostate cancer Paternal Grandfather     Social History   Tobacco Use   Smoking status: Never   Smokeless tobacco: Never  Vaping Use   Vaping status: Never Used  Substance Use Topics   Alcohol use: Yes    Alcohol/week: 2.0 standard drinks of alcohol    Types: 2 Standard drinks or equivalent per week    Comment: last use 05/31/23 1x/mo   Drug use: No     Current Facility-Administered Medications:    acetaminophen (TYLENOL) tablet 975 mg, 975 mg, Oral, Once, Domenick Gong, MD   ketorolac (TORADOL) 30 MG/ML injection 30 mg, 30 mg, Intramuscular, Once, Domenick Gong, MD  Current Outpatient Medications:    metaxalone (SKELAXIN) 800 MG tablet, Take 1 tablet (800 mg total) by mouth 3 (three) times daily. Take on an empty stomach, Disp: 30 tablet, Rfl: 0   naproxen (NAPROSYN) 500 MG tablet, Take 1 tablet (500 mg total) by mouth 2 (two) times daily., Disp: 20 tablet, Rfl: 0   Norgestimate-Ethinyl Estradiol Triphasic (TRI-LO-SPRINTEC) 0.18/0.215/0.25 MG-25 MCG tab, Take 1 tablet by mouth daily., Disp: 28 tablet, Rfl: 13   tiZANidine (ZANAFLEX) 4 MG tablet, Take 1 tablet (4 mg total) by mouth every 8 (eight) hours as needed for muscle spasms., Disp: 30 tablet, Rfl: 0   ondansetron (ZOFRAN) 4 MG tablet, Take 1 tablet (4 mg total) by mouth every 6 (six)  hours., Disp: 20 tablet, Rfl: 0  No Known Allergies   ROS  As noted in HPI.   Physical Exam  BP 111/77 (BP Location: Left Arm)   Pulse 70   Temp 98.6 F (37 C) (Oral)   Resp 19   LMP 07/24/2023 (Approximate)   SpO2 100%  *** Constitutional: Well developed, well nourished, appears to be in moderate discomfort. Eyes:  EOMI, conjunctiva normal bilaterally HENT: Normocephalic, atraumatic,mucus membranes moist Neck: No cervical lymphadenopathy.  No meningismus. Respiratory: Normal inspiratory effort Cardiovascular: Normal rate, regular rhythm GI: nondistended skin: No rash, skin intact Musculoskeletal: No C-spine, upper T-spine tenderness.  Positive bilateral trapezial tenderness, worse on the right.  Positive for right trapezial muscle spasm.  Pain with neck extension> flexion, rotation to left> right..  Bilateral shoulders nontender.  Sensation and motor intact in median/radial/ulnar distribution.  Patient able to AB duct to 90 degrees, although this causes pain.  Internal/external rotation of the shoulders intact and equal bilaterally. Neurologic: Alert & oriented x 3, no focal neuro deficits Psychiatric: Speech and behavior appropriate   ED Course   Medications  acetaminophen (TYLENOL) tablet 975 mg (has no administration in time range)  ketorolac (TORADOL) 30 MG/ML injection 30 mg (has no administration in time range)    Orders Placed This Encounter  Procedures   Pregnancy, urine  Standing Status:   Standing    Number of Occurrences:   1    Results for orders placed or performed during the hospital encounter of 08/18/23 (from the past 24 hours)  Pregnancy, urine     Status: None   Collection Time: 08/18/23  2:06 PM  Result Value Ref Range   Preg Test, Ur NEGATIVE NEGATIVE   No results found.  ED Clinical Impression  1. Torticollis, acute   2. Spasm of both trapezius muscles      ED Assessment/Plan   {The patient has been seen in Urgent Care in the  last 3 years. :1}  Patient presents with an acute atraumatic torticollis.  Doubt discitis, infectious cause, deep space throat infection.  Doubt acute bony process causing her symptoms in the absence of trauma or bony tenderness, thus deferring imaging today.     Checking urine pregnancy per patient request.  If negative, will give Toradol 30 mg IM x 1 with 975 mg of Tylenol.  Medrol Dosepak, Naprosyn/Tylenol, Skelaxin, and if this is too expensive, then Zanaflex.  2-day work note.  Deep tissue massage, gentle stretching, heating pad, hot shower.  ER return precautions given.  Discussed MDM, treatment plan, and plan for follow-up with patient. Discussed sn/sx that should prompt return to the ED. patient agrees with plan.   Meds ordered this encounter  Medications   acetaminophen (TYLENOL) tablet 975 mg   ketorolac (TORADOL) 30 MG/ML injection 30 mg    If urine pregnancy negative   tiZANidine (ZANAFLEX) 4 MG tablet    Sig: Take 1 tablet (4 mg total) by mouth every 8 (eight) hours as needed for muscle spasms.    Dispense:  30 tablet    Refill:  0   metaxalone (SKELAXIN) 800 MG tablet    Sig: Take 1 tablet (800 mg total) by mouth 3 (three) times daily. Take on an empty stomach    Dispense:  30 tablet    Refill:  0   naproxen (NAPROSYN) 500 MG tablet    Sig: Take 1 tablet (500 mg total) by mouth 2 (two) times daily.    Dispense:  20 tablet    Refill:  0      *This clinic note was created using Scientist, clinical (histocompatibility and immunogenetics). Therefore, there may be occasional mistakes despite careful proofreading.  ?

## 2024-04-23 ENCOUNTER — Ambulatory Visit

## 2024-04-23 ENCOUNTER — Other Ambulatory Visit: Payer: Self-pay

## 2024-04-23 VITALS — BP 126/80 | Ht 67.0 in | Wt 182.5 lb

## 2024-04-23 DIAGNOSIS — Z30011 Encounter for initial prescription of contraceptive pills: Secondary | ICD-10-CM

## 2024-04-23 DIAGNOSIS — Z30013 Encounter for initial prescription of injectable contraceptive: Secondary | ICD-10-CM | POA: Diagnosis not present

## 2024-04-23 DIAGNOSIS — Z3009 Encounter for other general counseling and advice on contraception: Secondary | ICD-10-CM

## 2024-04-23 DIAGNOSIS — Z309 Encounter for contraceptive management, unspecified: Secondary | ICD-10-CM | POA: Diagnosis not present

## 2024-04-23 MED ORDER — NORGESTIM-ETH ESTRAD TRIPHASIC 0.18/0.215/0.25 MG-25 MCG PO TABS
1.0000 | ORAL_TABLET | Freq: Every day | ORAL | 2 refills | Status: DC
Start: 2024-04-23 — End: 2024-06-10

## 2024-04-23 NOTE — Progress Notes (Signed)
 Patient spoke to nurse regarding need for [facility] refill before her appt. Has an appt in mid October. 2 refills sent to her pharmacy to last her until she sees provider in October.

## 2024-04-23 NOTE — Progress Notes (Signed)
 In nurse clinic for OCP refill. States she ran out of pills on Sunday,had no more refills. Consulted with provider. Authorized x2 refills until next annual appt on after 06/04/24. Refill sent to pharmacy. Voices no missed pills and voices no concerns.

## 2024-06-10 ENCOUNTER — Ambulatory Visit

## 2024-06-10 ENCOUNTER — Other Ambulatory Visit: Payer: Self-pay | Admitting: Family Medicine

## 2024-06-10 VITALS — BP 109/74 | HR 74 | Wt 175.0 lb

## 2024-06-10 DIAGNOSIS — Z30011 Encounter for initial prescription of contraceptive pills: Secondary | ICD-10-CM

## 2024-06-10 DIAGNOSIS — Z3009 Encounter for other general counseling and advice on contraception: Secondary | ICD-10-CM

## 2024-06-10 DIAGNOSIS — Z309 Encounter for contraceptive management, unspecified: Secondary | ICD-10-CM | POA: Diagnosis not present

## 2024-06-10 MED ORDER — NORGESTIM-ETH ESTRAD TRIPHASIC 0.18/0.215/0.25 MG-25 MCG PO TABS: 1.0000 | ORAL_TABLET | Freq: Every day | ORAL | 2 refills | Status: DC

## 2024-06-10 NOTE — Progress Notes (Signed)
 NP Damien Satchel having trouble with provider Medicaid enrollment and meds bouncing back.   I have re-sent script for COCs to pharmacy of choice.   Dorothyann Helling, MD 06/10/24  9:14 AM

## 2024-06-10 NOTE — Progress Notes (Signed)
 Pt is here for PE. Condoms declined. Kwadwo Senia Even,RN.

## 2024-06-10 NOTE — Progress Notes (Signed)
 [employer] [facility]PART[facility] [address], [address] [place] [address] Main phone: [phone]  Family Planning Visit - Repeat Yearly Visit  Subjective:  Erica Huerta is a 33 y.o. G3P1011  being seen today for an annual wellness visit and to discuss contraception options. The patient is currently using oral contraceptive for pregnancy prevention. Patient does not want a pregnancy in the next year. Desires OCP refill.  Patient has the following medical problems:  Patient Active Problem List   Diagnosis Date Noted   Encounter for annual routine gynecological examination 07/04/2017   Chief Complaint  Patient presents with   Annual Exam    PE   HPI Patient reports desire for OCP refill. No problems with her pills. Taking OCPs for cycle control. Not currently sexually active.  Last pap was NILM in 2024 - no history of abnormal pap tests. Patient denies other concerns.  Review of Systems  All other systems reviewed and are negative.  See flowsheet for further details and programmatic requirements Hyperlink available at the top of the signed note in blue.  Flow sheet content below:  Pregnancy Intention Screening Does the patient want to become pregnant in the next year?: No Does the patient's partner want to become pregnant in the next year?: No Would the patient like to discuss contraceptive options today?: No Other:  Password: 83 Sexual History What age did you start your period?: 14 How often do you have your period?: monthly Date of last sex?:  (a ayear ago) Has the patient had unprotected sex within the last 5 days?: No Do you have sex with men, women, both men and women?: Men only In the past 2 months how many partners have you had sex with?: 0 In the past 12 months, how many partners have you had sex with?: 0 Is it possible that any of your sex partners in the past 12 months had sex with someone else whild they were  still in a sexual relationship with you?: No What ways do you have sex?: Vaginal Do you or your partner use condoms and/or dental dams every time you have vaginal, oral or anal sex?: Declined Do you douche?: No Date of last HIV test?: 06/04/23 Have you ever had an STD?: No Have any of your partners had an STD?: No Have you or your partner ever shot up drugs?: No Have any of your partners used drugs in the past?: No Have you or your partners exchanged money or drugs for sex?: No  Diabetes screening This patient is 33 y.o. with a BMI of Body mass index is 27.41 kg/m.SABRA  Is patient eligible for diabetes screening (age >35 and BMI >25)?  no  Was Hgb A1c ordered? no  STI screening Patient reports 0 of partners in last year.  Does this patient desire STI screening?  No - declines Not sexually active, declines all STI testing.  Cervical Cancer Screening  Result Date Procedure Results Follow-ups  06/04/2023 IGP, Aptima HPV DIAGNOSIS:: Comment Specimen adequacy:: Comment Clinician Provided ICD10: Comment Performed by:: Comment PAP Smear Comment: . Note:: Comment Test Methodology: Comment HPV Aptima: Negative   07/04/2017 IGP,CtNgTv,rfx Aptima HPV ASCU DIAGNOSIS:: Comment Specimen adequacy:: Comment Clinician Provided ICD10: Comment Performed by:: Comment PAP Smear Comment: . Note:: Comment Test Methodology: Comment PAP Reflex: Comment Chlamydia, Nuc. Acid Amp: Negative Gonococcus, Nuc. Acid Amp: Negative Trich vag by NAA: Negative    Health Maintenance Due  Topic Date Due   Hepatitis C  Screening  Never done   DTaP/Tdap/Td (1 - Tdap) Never done   Hepatitis B Vaccines 19-59 Average Risk (1 of 3 - 19+ 3-dose series) Never done   HPV VACCINES (1 - 3-dose SCDM series) Never done   Influenza Vaccine  03/19/2024   COVID-19 Vaccine (1 - 2025-26 season) Never done   The following portions of the patient's history were reviewed and updated as appropriate: allergies, current  medications, past family history, past medical history, past social history, past surgical history and problem list. Problem list updated.  Objective:   Vitals:   06/10/24 0822  BP: 109/74  Pulse: 74  Weight: 175 lb (79.4 kg)   Physical Exam Exam conducted with a chaperone present Brett Orange).  Constitutional:      Appearance: Normal appearance.  HENT:     Head: Normocephalic.     Mouth/Throat:     Mouth: Mucous membranes are moist.     Pharynx: Oropharynx is clear. No pharyngeal swelling, oropharyngeal exudate or posterior oropharyngeal erythema.  Eyes:     General: No scleral icterus.       Right eye: No discharge.        Left eye: No discharge.  Cardiovascular:     Heart sounds: Normal heart sounds, S1 normal and S2 normal.  Pulmonary:     Effort: Pulmonary effort is normal.     Breath sounds: Normal breath sounds.  Chest:  Breasts:    Right: Normal.     Left: Normal.  Lymphadenopathy:     Cervical: No cervical adenopathy.  Skin:    General: Skin is warm and dry.  Neurological:     Mental Status: She is alert.  Psychiatric:        Mood and Affect: Mood normal.        Behavior: Behavior normal.    Assessment and Plan:  Erica Huerta is a 33 y.o. female G3P1011 presenting to the [facility] Department for an yearly wellness and contraception visit   1. Family planning (Primary)  Contraception counseling:  Reviewed options based on patient desire and reproductive life plan. Patient is interested in Oral Contraceptive. This was provided to the patient today.  Risks, benefits, and typical effectiveness rates were reviewed.  Questions were answered.  Written information was also given to the patient to review.    The patient will follow up in  1 years for surveillance.  The patient was told to call with any further questions, or with any concerns about this method of contraception.  Emphasized use of condoms 100% of the time for STI  prevention.  Emergency Contraception Precautions (ECP): Patient assessed for need of ECP. She is not a candidate based on OCPs used correctly without missed doses.   2. Visit for oral contraceptive prescription  - OCP's refilled by Dr. Dorothyann Helling and sent to preferred pharmacy  Return in about 1 year (around 06/10/2025).  No future appointments.  Damien FORBES Satchel, NP

## 2024-08-13 ENCOUNTER — Encounter: Payer: Self-pay | Admitting: Emergency Medicine

## 2024-08-13 ENCOUNTER — Emergency Department
Admission: EM | Admit: 2024-08-13 | Discharge: 2024-08-13 | Disposition: A | Discharge status: Home / Self Care | Attending: Emergency Medicine | Admitting: Emergency Medicine

## 2024-08-13 DIAGNOSIS — J101 Influenza due to other identified influenza virus with other respiratory manifestations: Secondary | ICD-10-CM | POA: Diagnosis not present

## 2024-08-13 DIAGNOSIS — R509 Fever, unspecified: Secondary | ICD-10-CM | POA: Diagnosis present

## 2024-08-13 LAB — RESP PANEL BY RT-PCR (RSV, FLU A&B, COVID)  RVPGX2
Influenza A by PCR: NEGATIVE
Influenza B by PCR: POSITIVE — AB
Resp Syncytial Virus by PCR: NEGATIVE
SARS Coronavirus 2 by RT PCR: NEGATIVE

## 2024-08-13 NOTE — Discharge Instructions (Signed)
 You tested positive for the flu today.  This is a viral illness which will resolve on its own with time.  You do not need an antibiotic.  You can take over-the-counter cold medicine as needed to manage your symptoms.  If you are taking combination cold medicine keep in mind that this often contains Tylenol so if you need additional medication for body aches or fever control please take Motrin or ibuprofen.  Your symptoms should resolve with time, if you have had symptoms for greater than 10 days please be evaluated by another healthcare provider as at this point it may have developed into a bacterial infection which requires a different treatment.  Return to the emergency department with worsening symptoms.

## 2024-08-13 NOTE — ED Provider Notes (Signed)
 "  [facility] Provider Note    Event Date/Time   First MD Initiated Contact with Patient 08/13/24 1206     (approximate)   History   Fever   HPI  Erica Huerta is a 33 y.o. female with PMH of anxiety and depression presents for evaluation of fever, headaches and bodyaches that began on Wednesday.  Patient is here with her son who has the same symptoms.      Physical Exam   Triage Vital Signs: ED Triage Vitals  Encounter Vitals Group     BP 08/13/24 1139 117/87     Girls Systolic BP Percentile --      Girls Diastolic BP Percentile --      Boys Systolic BP Percentile --      Boys Diastolic BP Percentile --      Pulse Rate 08/13/24 1139 99     Resp 08/13/24 1139 18     Temp 08/13/24 1139 98.9 F (37.2 C)     Temp Source 08/13/24 1139 Oral     SpO2 08/13/24 1139 97 %     Weight --      Height --      Head Circumference --      Peak Flow --      Pain Score 08/13/24 1203 0     Pain Loc --      Pain Education --      Exclude from Growth Chart --     Most recent vital signs: Vitals:   08/13/24 1139  BP: 117/87  Pulse: 99  Resp: 18  Temp: 98.9 F (37.2 C)  SpO2: 97%   General: Awake, no distress.  CV:  Good peripheral perfusion.  RRR Resp:  Normal effort.  CTAB. Abd:  No distention.  Other:  Oral mucous membranes are moist, no pharyngeal erythema.   ED Results / Procedures / Treatments   Labs (all labs ordered are listed, but only abnormal results are displayed) Labs Reviewed  RESP PANEL BY RT-PCR (RSV, FLU A&B, COVID)  [id number] - Abnormal; Notable for the following components:      Result Value   Influenza B by PCR POSITIVE (*)    All other components within normal limits     PROCEDURES:  Critical Care performed: No  Procedures   MEDICATIONS ORDERED IN ED: Medications - No data to display   IMPRESSION / MDM / ASSESSMENT AND PLAN / ED COURSE  I reviewed the triage vital signs and the nursing notes.                              33 year old female presents for evaluation of fever.  Vital signs are stable patient NAD on exam.  Differential diagnosis includes, but is not limited to, flu, COVID, RSV, bronchitis, pneumonia, other viral infection.  Patient's presentation is most consistent with acute complicated illness / injury requiring diagnostic workup.  Respiratory panel is positive for influenza B.  Physical exam is overall reassuring and patient is well-appearing.  Did advise on symptomatic management.  Patient did not need a note for work.  She voiced understanding, questions were answered and she stable at discharge.      FINAL CLINICAL IMPRESSION(S) / ED DIAGNOSES   Final diagnoses:  Influenza B     Rx / DC Orders   ED Discharge Orders     None        Note:  This  document was prepared using Conservation officer, historic buildings and may include unintentional dictation errors.   Cleaster Tinnie LABOR, PA-C 08/13/24 1341    Dorothyann Drivers, MD 08/13/24 1830  "

## 2024-08-13 NOTE — ED Triage Notes (Signed)
 Pt reports fever, headaches, and body aches.

## 2024-09-10 ENCOUNTER — Other Ambulatory Visit: Payer: Self-pay | Admitting: Family Medicine

## 2024-09-10 DIAGNOSIS — Z30011 Encounter for initial prescription of contraceptive pills: Secondary | ICD-10-CM
